# Patient Record
Sex: Female | Born: 1935 | Race: White | Hispanic: No | Marital: Married | Smoking: Never smoker
Health system: Southern US, Community
[De-identification: ages and names within clinical notes are randomized; demographics above are authoritative.]

## PROBLEM LIST (undated history)

## (undated) DIAGNOSIS — K5909 Other constipation: Secondary | ICD-10-CM

## (undated) DIAGNOSIS — N3289 Other specified disorders of bladder: Secondary | ICD-10-CM

## (undated) DIAGNOSIS — M5126 Other intervertebral disc displacement, lumbar region: Secondary | ICD-10-CM

## (undated) DIAGNOSIS — Z7901 Long term (current) use of anticoagulants: Secondary | ICD-10-CM

## (undated) DIAGNOSIS — K219 Gastro-esophageal reflux disease without esophagitis: Secondary | ICD-10-CM

## (undated) DIAGNOSIS — G909 Disorder of the autonomic nervous system, unspecified: Secondary | ICD-10-CM

## (undated) DIAGNOSIS — I4891 Unspecified atrial fibrillation: Secondary | ICD-10-CM

## (undated) DIAGNOSIS — E538 Deficiency of other specified B group vitamins: Secondary | ICD-10-CM

## (undated) DIAGNOSIS — E559 Vitamin D deficiency, unspecified: Secondary | ICD-10-CM

## (undated) DIAGNOSIS — K439 Ventral hernia without obstruction or gangrene: Secondary | ICD-10-CM

## (undated) DIAGNOSIS — I10 Essential (primary) hypertension: Secondary | ICD-10-CM

## (undated) DIAGNOSIS — L84 Corns and callosities: Secondary | ICD-10-CM

## (undated) DIAGNOSIS — N3281 Overactive bladder: Secondary | ICD-10-CM

## (undated) DIAGNOSIS — R609 Edema, unspecified: Secondary | ICD-10-CM

## (undated) DIAGNOSIS — J449 Chronic obstructive pulmonary disease, unspecified: Secondary | ICD-10-CM

## (undated) DIAGNOSIS — I872 Venous insufficiency (chronic) (peripheral): Secondary | ICD-10-CM

## (undated) DIAGNOSIS — R21 Rash and other nonspecific skin eruption: Secondary | ICD-10-CM

## (undated) DIAGNOSIS — I509 Heart failure, unspecified: Secondary | ICD-10-CM

## (undated) DIAGNOSIS — M109 Gout, unspecified: Secondary | ICD-10-CM

## (undated) DIAGNOSIS — J45902 Unspecified asthma with status asthmaticus: Secondary | ICD-10-CM

## (undated) DIAGNOSIS — G4700 Insomnia, unspecified: Secondary | ICD-10-CM

## (undated) HISTORY — PX: ABDOMINAL HYSTERECTOMY: SHX81

## (undated) HISTORY — DX: Long term (current) use of anticoagulants: Z79.01

## (undated) HISTORY — PX: REPLACEMENT TOTAL KNEE BILATERAL: SUR1225

## (undated) HISTORY — DX: Corns and callosities: L84

## (undated) HISTORY — DX: Other intervertebral disc displacement, lumbar region: M51.26

## (undated) HISTORY — DX: Disorder of the autonomic nervous system, unspecified: G90.9

## (undated) HISTORY — DX: Rash and other nonspecific skin eruption: R21

## (undated) HISTORY — DX: Unspecified atrial fibrillation: I48.91

## (undated) HISTORY — DX: Unspecified asthma with status asthmaticus: J45.902

## (undated) HISTORY — DX: Vitamin D deficiency, unspecified: E55.9

## (undated) HISTORY — PX: TONSILLECTOMY: SUR1361

## (undated) HISTORY — DX: Heart failure, unspecified: I50.9

## (undated) HISTORY — DX: Venous insufficiency (chronic) (peripheral): I87.2

## (undated) HISTORY — DX: Essential (primary) hypertension: I10

## (undated) HISTORY — DX: Deficiency of other specified B group vitamins: E53.8

## (undated) HISTORY — DX: Insomnia, unspecified: G47.00

## (undated) HISTORY — DX: Gastro-esophageal reflux disease without esophagitis: K21.9

## (undated) HISTORY — DX: Ventral hernia without obstruction or gangrene: K43.9

## (undated) HISTORY — DX: Other specified disorders of bladder: N32.89

## (undated) HISTORY — DX: Gout, unspecified: M10.9

## (undated) HISTORY — DX: Chronic obstructive pulmonary disease, unspecified: J44.9

## (undated) HISTORY — DX: Overactive bladder: N32.81

## (undated) HISTORY — PX: BLADDER SURGERY: SHX569

## (undated) HISTORY — DX: Other constipation: K59.09

## (undated) HISTORY — DX: Edema, unspecified: R60.9

## (undated) HISTORY — PX: CATARACT EXTRACTION: SUR2

---

## 2015-03-01 DIAGNOSIS — I10 Essential (primary) hypertension: Secondary | ICD-10-CM | POA: Insufficient documentation

## 2015-03-01 DIAGNOSIS — I4892 Unspecified atrial flutter: Secondary | ICD-10-CM

## 2015-03-01 DIAGNOSIS — Z86711 Personal history of pulmonary embolism: Secondary | ICD-10-CM

## 2015-03-01 HISTORY — DX: Personal history of pulmonary embolism: Z86.711

## 2015-03-01 HISTORY — DX: Unspecified atrial flutter: I48.92

## 2015-03-01 HISTORY — DX: Essential (primary) hypertension: I10

## 2017-06-08 ENCOUNTER — Telehealth: Payer: Self-pay | Admitting: Cardiology

## 2017-06-08 NOTE — Telephone Encounter (Signed)
°*  STAT* If patient is at the pharmacy, call can be transferred to refill team.   1. Which medications need to be refilled? (please list name of each medication and dose if known) Metoprolol Tartrate 25mg   2. Which pharmacy/location (including street and city if local pharmacy) is medication to be sent to?[facility]   3. Do they need a 30 day or 90 day supply? 90   *STAT* If patient is at the pharmacy, call can be transferred to refill team.   1. Which medications need to be refilled? (please list name of each medication and dose if known) Metorpolol 50mg   2. Which pharmacy/location (including street and city if local pharmacy) is medication to be sent to? [facility]   3. Do they need a 30 day or 90 day supply? 90

## 2017-06-08 NOTE — Telephone Encounter (Signed)
Rx refill

## 2017-06-08 NOTE — Telephone Encounter (Signed)
[facility] will contact pt to verify dosage

## 2017-06-08 NOTE — Telephone Encounter (Signed)
Lm For pt to call back to clarify dose before sending in rx.Marland KitchenMarland Kitchen

## 2017-06-09 ENCOUNTER — Other Ambulatory Visit: Payer: Self-pay

## 2017-06-09 MED ORDER — METOPROLOL TARTRATE 50 MG PO TABS: 50.0000 mg | ORAL_TABLET | Freq: Every day | ORAL | 3 refills | Status: DC

## 2017-06-09 MED ORDER — METOPROLOL TARTRATE 25 MG PO TABS: 25.0000 mg | ORAL_TABLET | Freq: Every day | ORAL | 3 refills | Status: DC

## 2017-06-09 NOTE — Telephone Encounter (Signed)
Spoke with pts husband to verify dose of metoprolol. Pt is currently taking metoprolol tart 50mg  in the am, and metorpolol tart 25mg  in the evening.    Pts husband request both mgs to be sent to pharmacy, as it is not easy to cut the pills in half.  Both sent to pharmacy.

## 2017-06-15 ENCOUNTER — Other Ambulatory Visit: Payer: Self-pay

## 2017-06-30 ENCOUNTER — Encounter: Payer: Self-pay | Admitting: Cardiology

## 2017-06-30 ENCOUNTER — Ambulatory Visit: Payer: Medicare Other | Admitting: Cardiology

## 2017-06-30 VITALS — BP 112/70 | HR 70 | Ht 61.0 in | Wt 195.1 lb

## 2017-06-30 DIAGNOSIS — I4892 Unspecified atrial flutter: Secondary | ICD-10-CM

## 2017-06-30 DIAGNOSIS — I10 Essential (primary) hypertension: Secondary | ICD-10-CM

## 2017-06-30 DIAGNOSIS — Z86711 Personal history of pulmonary embolism: Secondary | ICD-10-CM

## 2017-06-30 MED ORDER — RIVAROXABAN 20 MG PO TABS: 20.0000 mg | ORAL_TABLET | Freq: Every day | ORAL | 1 refills | Status: DC

## 2017-06-30 NOTE — Addendum Note (Signed)
Addended by: Craige Cotta on: 06/30/2017 02:43 PM   Modules accepted: Orders

## 2017-06-30 NOTE — Patient Instructions (Addendum)
Medication Instructions:  Your physician recommends that you continue on your current medications as directed. Please refer to the Current Medication list given to you today.  Xarelto samples given and refill sent Stop Metoprolol 25 mg  Labwork: .None  Testing/Procedures: None  Follow-Up: Your physician recommends that you schedule a follow-up appointment in: 6 months   Any Other Special Instructions Will Be Listed Below (If Applicable).     If you need a refill on your cardiac medications before your next appointment, please call your pharmacy.   [facility]  Garey Ham, RN, BSN

## 2017-06-30 NOTE — Progress Notes (Signed)
Cardiology Office Note:    Date:  06/30/2017   ID:  Kerri Rogers, DOB February 01, 1935, MRN [id number]  PCP:  Lucianne LeiUppin, Nina, MD  Cardiologist:  Garwin Brothersajan R Londa Mackowski, MD   Referring MD: No ref. provider found    ASSESSMENT:    1. Atrial flutter, paroxysmal (HCC)   2. Essential hypertension   3. History of pulmonary embolism    PLAN:    In order of problems listed above:  1. I discussed with the patient atrial fibrillation, disease process. Management and therapy including rate and rhythm control, anticoagulation benefits and potential risks were discussed extensively with the patient. Patient had multiple questions which were answered to patient's satisfaction. 2. Her blood pressure is stable. 3. Importance of regular exercise stressed.  Diet was discussed for obesity and risks of obesity explained. 4. Patient will be seen in follow-up appointment in 6 months or earlier if the patient has any concerns.   Medication Adjustments/Labs and Tests Ordered: Current medicines are reviewed at length with the patient today.  Concerns regarding medicines are outlined above.  No orders of the defined types were placed in this encounter.  No orders of the defined types were placed in this encounter.    History of Present Illness:    Kerri BalDoris Bridgeforth is a 82 y.o. female who is being seen today for the evaluation of atrial flutter and fibrillation.  Patient is a pleasant 82 year old female.  This patient has been under my care in my previous practice.  He is here now to transfer his care and be established with my current practice.  She has atrial flutter and fibrillation.  She denies any problems at this time and takes care of activities of daily living.  No chest pain orthopnea or PND.  At the time of my evaluation, the patient is alert awake oriented and in no distress.  She recently lost her son but has taken it well and moving forward in a strong way.  Past Medical History:  Diagnosis Date  . Asthma  with bronchitis and status asthmaticus   . Atrial fibrillation (HCC)   . B12 deficiency   . Bladder spasm   . CHF (congestive heart failure) (HCC)   . Chronic constipation   . COPD (chronic obstructive pulmonary disease) (HCC)   . Edema   . GERD (gastroesophageal reflux disease)   . Gout   . Herniated lumbar intervertebral disc   . Hypertension   . Insomnia   . Long term (current) use of anticoagulants   . Overactive bladder   . Peripheral autonomic neuropathy   . Pre-ulcerative calluses   . Skin rash   . Stasis dermatitis   . Ventral hernia   . Vitamin D deficiency     Past Surgical History:  Procedure Laterality Date  . ABDOMINAL HYSTERECTOMY    . BLADDER SURGERY    . CATARACT EXTRACTION    . REPLACEMENT TOTAL KNEE BILATERAL    . TONSILLECTOMY      Current Medications: Current Meds  Medication Sig  . B Complex Vitamins (VITAMIN B COMPLEX PO) Take 1 tablet daily by mouth.   . calcium carbonate (OS-CAL) 600 MG TABS tablet Take 1,200 mg daily by mouth.   . Cholecalciferol (VITAMIN D3) 1000 units CAPS Take by mouth.  . DOCOSAHEXAENOIC ACID PO Take 1,200 mg daily by mouth.   . ferrous sulfate 324 (65 Fe) MG TBEC Take 324 mg by mouth.  . furosemide (LASIX) 20 MG tablet Take 20 mg 2 (two)  times daily by mouth.   . gabapentin (NEURONTIN) 800 MG tablet Take 800 mg daily by mouth.   Marland Kitchen MAGNESIUM PO Take 1 tablet daily by mouth.   . metoprolol tartrate (LOPRESSOR) 50 MG tablet Take 1 tablet (50 mg total) by mouth daily.  . Potassium 99 MG TABS Take 99 mg daily by mouth.   . rivaroxaban (XARELTO) 20 MG TABS tablet Take 20 mg daily with supper by mouth.   . [DISCONTINUED] metoprolol tartrate (LOPRESSOR) 25 MG tablet Take 1 tablet (25 mg total) by mouth at bedtime.     Allergies:   Flagyl [metronidazole]; Penicillins; and Sulfamethoxazole-trimethoprim   Social History   Socioeconomic History  . Marital status: Married    Spouse name: None  . Number of children: None  .  Years of education: None  . Highest education level: None  Social Needs  . Financial resource strain: None  . Food insecurity - worry: None  . Food insecurity - inability: None  . Transportation needs - medical: None  . Transportation needs - non-medical: None  Occupational History  . None  Tobacco Use  . Smoking status: Never Smoker  . Smokeless tobacco: Never Used  Substance and Sexual Activity  . Alcohol use: No  . Drug use: No  . Sexual activity: None  Other Topics Concern  . None  Social History Narrative  . None     Family History: The patient's family history includes Anxiety disorder in her sister; Asthma in her sister; Atrial fibrillation in her sister; Diabetes in her mother; GER disease in her sister; Heart failure in her mother; Hypertension in her brother.  ROS:   Please see the history of present illness.    All other systems reviewed and are negative.  EKGs/Labs/Other Studies Reviewed:    The following studies were reviewed today: I reviewed previous records and discussed with the patient at length.   Recent Labs: No results found for requested labs within last 8760 hours.  Recent Lipid Panel No results found for: CHOL, TRIG, HDL, CHOLHDL, VLDL, LDLCALC, LDLDIRECT  Physical Exam:    VS:  BP 112/70   Pulse 70   Ht 5\' 1"  (1.549 m)   Wt 195 lb 1.3 oz (88.5 kg)   SpO2 94%   BMI 36.86 kg/m     Wt Readings from Last 3 Encounters:  06/30/17 195 lb 1.3 oz (88.5 kg)     GEN: Patient is in no acute distress HEENT: Normal NECK: No JVD; No carotid bruits LYMPHATICS: No lymphadenopathy CARDIAC: S1 S2 regular, 2/6 systolic murmur at the apex. RESPIRATORY:  Clear to auscultation without rales, wheezing or rhonchi  ABDOMEN: Soft, non-tender, non-distended MUSCULOSKELETAL:  No edema; No deformity  SKIN: Warm and dry NEUROLOGIC:  Alert and oriented x 3 PSYCHIATRIC:  Normal affect    Signed, Garwin Brothers, MD  06/30/2017 11:12 AM    [facility]

## 2017-06-30 NOTE — Addendum Note (Signed)
Addended by: Craige Cotta on: 06/30/2017 11:19 AM   Modules accepted: Orders

## 2017-09-24 ENCOUNTER — Ambulatory Visit: Payer: Medicare Other | Admitting: Endocrinology

## 2017-09-24 ENCOUNTER — Encounter: Payer: Self-pay | Admitting: Endocrinology

## 2017-09-24 LAB — TSH: TSH: 1.54 u[IU]/mL (ref 0.35–4.50)

## 2017-09-24 LAB — BASIC METABOLIC PANEL
BUN: 37 mg/dL — AB (ref 6–23)
CALCIUM: 9.9 mg/dL (ref 8.4–10.5)
CO2: 32 mEq/L (ref 19–32)
CREATININE: 1.39 mg/dL — AB (ref 0.40–1.20)
Chloride: 104 mEq/L (ref 96–112)
GFR: 38.7 mL/min — AB (ref >60.00)
Glucose, Bld: 97 mg/dL (ref 70–99)
Potassium: 4.3 mEq/L (ref 3.5–5.1)
Sodium: 140 mEq/L (ref 135–145)

## 2017-09-24 NOTE — Patient Instructions (Signed)
Stop Vitamin D

## 2017-09-24 NOTE — Progress Notes (Signed)
Patient ID: Kerri Rogers, female   DOB: April 02, 1935, 83 y.o.   MRN: [id number]          Chief complaint: High calcium  History of Present Illness:  Referring physician:   Review of records show that she has had a high calcium since at least 10/2016  No detailed records are available about her previous calcium from her PCP but she is being referred here since her recent calcium in November 2018 was 10.9, previously 10.3 PTH level is not available Patient was told to stop her calcium supplements in November which she was taking for bone health  She has been taking vitamin D, 1000 units daily separately for several years  No results found for: CALCIUM  The hypercalcemia is not associated with any fractures, renal insufficiency, history of kidney stones, sarcoidosis, known carcinoma, thyroid disease.   She probably has had a height loss of about 2 inches since youth She thinks she had a total hysterectomy at the age of 9 and took hormone replacement for about 4-5 years subsequently  Prior serologic and radiologic studies have included:  Bone density in 2015 was normal with T score 0.7, actual report not available    No results found for: PTH, CALCIUM, CAION, PHOS    25 (OH) Vitamin D level 81.3  Prior treatment has included  Allergies as of 09/24/2017      Reactions   Flagyl [metronidazole]    Penicillins    Sulfamethoxazole-trimethoprim       Medication List        Accurate as of 09/24/17  2:51 PM. Always use your most recent med list.          DOCOSAHEXAENOIC ACID PO Take 1,200 mg daily by mouth.   ferrous sulfate 324 (65 Fe) MG Tbec Take 324 mg by mouth.   furosemide 20 MG tablet Commonly known as:  LASIX Take 20 mg 2 (two) times daily by mouth.   gabapentin 800 MG tablet Commonly known as:  NEURONTIN Take 800 mg daily by mouth.   MAGNESIUM PO Take 1 tablet daily by mouth.   metoprolol tartrate 50 MG tablet Commonly known as:  LOPRESSOR Take 1 tablet (50  mg total) by mouth daily.   multivitamin tablet Take 1 tablet by mouth daily.   OXYBUTYNIN CHLORIDE PO Take 5 mg by mouth at bedtime.   Potassium 99 MG Tabs Take 99 mg daily by mouth.   rivaroxaban 20 MG Tabs tablet Commonly known as:  XARELTO Take 1 tablet (20 mg total) daily with supper by mouth.   traMADol 50 MG tablet Commonly known as:  ULTRAM Take 50 mg by mouth every 6 (six) hours as needed.   VITAMIN B COMPLEX PO Take 1 tablet daily by mouth.   Vitamin D3 1000 units Caps Take by mouth.       Allergies:  Allergies  Allergen Reactions  . Flagyl [Metronidazole]   . Penicillins   . Sulfamethoxazole-Trimethoprim     Past Medical History:  Diagnosis Date  . Asthma with bronchitis and status asthmaticus   . Atrial fibrillation (HCC)   . B12 deficiency   . Bladder spasm   . CHF (congestive heart failure) (HCC)   . Chronic constipation   . COPD (chronic obstructive pulmonary disease) (HCC)   . Edema   . GERD (gastroesophageal reflux disease)   . Gout   . Herniated lumbar intervertebral disc   . Hypertension   . Insomnia   . Long term (current) use  of anticoagulants   . Overactive bladder   . Peripheral autonomic neuropathy   . Pre-ulcerative calluses   . Skin rash   . Stasis dermatitis   . Ventral hernia   . Vitamin D deficiency     Past Surgical History:  Procedure Laterality Date  . ABDOMINAL HYSTERECTOMY    . BLADDER SURGERY    . CATARACT EXTRACTION    . REPLACEMENT TOTAL KNEE BILATERAL    . TONSILLECTOMY      Family History  Problem Relation Age of Onset  . Diabetes Mother   . Heart failure Mother   . GER disease Sister   . Anxiety disorder Sister   . Atrial fibrillation Sister   . Asthma Sister   . Hypertension Brother     Social History:  reports that  has never smoked. she has never used smokeless tobacco. She reports that she does not drink alcohol or use drugs.  Review of Systems  Constitutional: Negative for reduced  appetite.       She has lost a little weight recently from trying  HENT: Negative for trouble swallowing.   Respiratory: Negative for shortness of breath.   Cardiovascular: Positive for leg swelling.       No recent palpitations or symptoms of her cardiac arrhythmia which is being followed by cardiologist  Gastrointestinal: Negative for abdominal pain.  Endocrine: Negative for fatigue, general weakness and polydipsia.  Genitourinary: Negative for dysuria.  Musculoskeletal: Positive for joint pain.  Skin: Negative for rash.  Neurological: Positive for numbness and balance difficulty.     EXAM:  BP 113/73 (BP Location: Left Arm, Patient Position: Sitting, Cuff Size: Large)   Pulse 82   Ht 5\' 1"  (1.549 m)   Wt 193 lb 6.4 oz (87.7 kg)   SpO2 94%   BMI 36.54 kg/m   GENERAL: She has generalized obesity especially abdominal  No pallor, clubbing, lymphadenopathy in the neck    Skin:  no rash or pigmentation.  EYES:  Externally normal.  Fundii:  Not examined  ENT: Oral mucosa and tongue normal.  THYROID:  Not palpable.   HEART:  Normal  S1 and S2; no murmur or click.  CHEST:  Normal shape Lungs:   Vescicular breath sounds heard equally.  No crepitations/ wheeze.  ABDOMEN:  No distention.  Liver and spleen not palpable.  No other mass or tenderness.  NEUROLOGICAL: .Reflexes are normal bilaterally at biceps, absent at ankles.  SPINE AND JOINTS:  Normal.  Extremities: she has 2+ edema on the left and 1+ on the right ankle  Assessment/Plan:   HYPERCALCEMIA:  She has had mild hypercalcemia with the last level of 10.9, prior level 10.3 and no other records available for review  She appears to be asymptomatic although has not had a bone density since probably 2015 Most likely she has mild primary hyperparathyroidism, has not had any medications or other conditions to cause hypercalcemia However her vitamin D level is upper normal as of 11/18  Discussed that the main  implication of hyperparathyroidism would be bone loss which she has not had so far despite her having premature menopause  Discussed the nature of primary hyperparathyroidism as well as normal role of the parathyroid glands. Discussed potential  effects of hyperparathyroidism long-term on bone health, kidney stones and kidney function Explained to patient that surgery is indicated only there are symptoms of high calcium, calcium level over 1 point above the normal range or known osteoporosis.  Explained that if surgery  is indicated this would be done after doing a parathyroid scan and most likely if the patient has single adenoma will need minimally invasive surgery  Recommendations:  Repeat bone density unless it has been done within the last year or 2  Stop vitamin D  Check PTH and 1, 25 hydroxy vitamin D level  If her bone density is still normal and she has hyperparathyroidism and no further action needed  Reather Littler 09/24/2017, 2:51 PM

## 2017-09-28 LAB — PARATHYROID HORMONE, INTACT (NO CA): PTH: 37 pg/mL (ref 15–65)

## 2017-09-28 LAB — VITAMIN D 1,25 DIHYDROXY
VITAMIN D3 1, 25 (OH): 21 pg/mL
Vitamin D 1, 25 (OH)2 Total: 21 pg/mL
Vitamin D2 1, 25 (OH)2: <10 pg/mL

## 2017-10-05 ENCOUNTER — Telehealth: Payer: Self-pay

## 2017-10-05 NOTE — Telephone Encounter (Signed)
-----   Message from Reather Littler, MD sent at 10/01/2017  8:23 PM EST ----- Her blood tests show overactive parathyroid gland likely; vitamin D is relatively low and she can increase the dose to 2000 units daily She needs to have bone density report forwarded to [place] from [facility]

## 2017-10-05 NOTE — Telephone Encounter (Signed)
Pt is returning call about lab results.  Please advise

## 2017-10-05 NOTE — Telephone Encounter (Signed)
Spoke to patient. Gave lab results and instructions. Pt verbalized understanding.

## 2017-10-05 NOTE — Telephone Encounter (Signed)
Called pt. No answer. Left vmail to rtn call.

## 2017-10-06 ENCOUNTER — Telehealth: Payer: Self-pay

## 2017-10-06 NOTE — Telephone Encounter (Signed)
Received copy of bone density from [facility] and pt is aware that it is normal and to have repeat in 2 years

## 2018-01-05 ENCOUNTER — Ambulatory Visit: Payer: Medicare Other | Admitting: Cardiology

## 2018-01-05 ENCOUNTER — Encounter: Payer: Self-pay | Admitting: Cardiology

## 2018-01-05 VITALS — BP 130/68 | HR 102 | Ht 61.0 in | Wt 195.1 lb

## 2018-01-05 DIAGNOSIS — I10 Essential (primary) hypertension: Secondary | ICD-10-CM | POA: Diagnosis not present

## 2018-01-05 DIAGNOSIS — I4892 Unspecified atrial flutter: Secondary | ICD-10-CM

## 2018-01-05 NOTE — Progress Notes (Signed)
Cardiology Office Note:    Date:  01/05/2018   ID:  Kerri Rogers, DOB November 14, 1934, MRN [id number]  PCP:  Kerri Lei, MD  Cardiologist:  Kerri Brothers, MD   Referring MD: Kerri Lei, MD    ASSESSMENT:    1. Atrial flutter, paroxysmal (HCC)   2. Essential hypertension    PLAN:    In order of problems listed above:  1. Primary prevention stressed with the patient.  Importance of compliance with diet and medications stressed and she vocalized understanding.  Her blood pressure is stable. 2. I discussed with the patient atrial fibrillation, disease process. Management and therapy including rate and rhythm control, anticoagulation benefits and potential risks were discussed extensively with the patient. Patient had multiple questions which were answered to patient's satisfaction. 3. She had blood work by her primary care physician on a regular basis and plans to get more blood work when she sees her in a month or so.  Her heart rate is stable and she checks it on a regular basis.  She has no symptoms at this time and she will continue her medical management.Patient will be seen in follow-up appointment in 6 months or earlier if the patient has any concerns.    Medication Adjustments/Labs and Tests Ordered: Current medicines are reviewed at length with the patient today.  Concerns regarding medicines are outlined above.  No orders of the defined types were placed in this encounter.  No orders of the defined types were placed in this encounter.    Chief Complaint  Patient presents with  . Follow-up  . Atrial Fibrillation     History of Present Illness:    Kerri Rogers is a 83 y.o. female.  The patient has history of atrial flutter and fibrillation.  She is on anticoagulation for this.  She denies any problems at this time and takes care of activities of daily living.  She leads a fairly sedentary lifestyle because of orthopedic issues and ambulates with a cane.  Her gait is stable  and she has not had any falls.  At the time of my evaluation, the patient is alert awake oriented and in no distress.  Past Medical History:  Diagnosis Date  . Asthma with bronchitis and status asthmaticus   . Atrial fibrillation (HCC)   . B12 deficiency   . Bladder spasm   . CHF (congestive heart failure) (HCC)   . Chronic constipation   . COPD (chronic obstructive pulmonary disease) (HCC)   . Edema   . GERD (gastroesophageal reflux disease)   . Gout   . Herniated lumbar intervertebral disc   . Hypertension   . Insomnia   . Long term (current) use of anticoagulants   . Overactive bladder   . Peripheral autonomic neuropathy   . Pre-ulcerative calluses   . Skin rash   . Stasis dermatitis   . Ventral hernia   . Vitamin D deficiency     Past Surgical History:  Procedure Laterality Date  . ABDOMINAL HYSTERECTOMY    . BLADDER SURGERY    . CATARACT EXTRACTION    . REPLACEMENT TOTAL KNEE BILATERAL    . TONSILLECTOMY      Current Medications: Current Meds  Medication Sig  . B Complex Vitamins (VITAMIN B COMPLEX PO) Take 1 tablet daily by mouth.   . Cholecalciferol (VITAMIN D3) 1000 units CAPS Take 1 capsule by mouth daily.   . DOCOSAHEXAENOIC ACID PO Take 1,200 mg daily by mouth.   . ferrous  sulfate 324 (65 Fe) MG TBEC Take 324 mg by mouth daily.   . furosemide (LASIX) 20 MG tablet Take 20 mg 2 (two) times daily by mouth.   . gabapentin (NEURONTIN) 800 MG tablet Take 800 mg daily by mouth.   Marland Kitchen MAGNESIUM PO Take 1 tablet daily by mouth.   . metoprolol tartrate (LOPRESSOR) 50 MG tablet Take 1 tablet (50 mg total) by mouth daily.  . Multiple Vitamin (MULTIVITAMIN) tablet Take 1 tablet by mouth daily.  . OXYBUTYNIN CHLORIDE PO Take 5 mg by mouth at bedtime.  . Potassium 99 MG TABS Take 99 mg daily by mouth.   . rivaroxaban (XARELTO) 20 MG TABS tablet Take 1 tablet (20 mg total) daily with supper by mouth.  . traMADol (ULTRAM) 50 MG tablet Take 50 mg by mouth every 6 (six) hours  as needed.     Allergies:   Flagyl [metronidazole]; Penicillins; and Sulfamethoxazole-trimethoprim   Social History   Socioeconomic History  . Marital status: Married    Spouse name: Not on file  . Number of children: Not on file  . Years of education: Not on file  . Highest education level: Not on file  Occupational History  . Not on file  Social Needs  . Financial resource strain: Not on file  . Food insecurity:    Worry: Not on file    Inability: Not on file  . Transportation needs:    Medical: Not on file    Non-medical: Not on file  Tobacco Use  . Smoking status: Never Smoker  . Smokeless tobacco: Never Used  Substance and Sexual Activity  . Alcohol use: No  . Drug use: No  . Sexual activity: Not on file  Lifestyle  . Physical activity:    Days per week: Not on file    Minutes per session: Not on file  . Stress: Not on file  Relationships  . Social connections:    Talks on phone: Not on file    Gets together: Not on file    Attends religious service: Not on file    Active member of club or organization: Not on file    Attends meetings of clubs or organizations: Not on file    Relationship status: Not on file  Other Topics Concern  . Not on file  Social History Narrative  . Not on file     Family History: The patient's family history includes Anxiety disorder in her sister; Asthma in her sister; Atrial fibrillation in her sister; Diabetes in her mother; GER disease in her sister; Heart failure in her mother; Hypertension in her brother. There is no history of Thyroid disease.  ROS:   Please see the history of present illness.    All other systems reviewed and are negative.  EKGs/Labs/Other Studies Reviewed:    The following studies were reviewed today: I discussed my findings with the patient at length.   Recent Labs: 09/24/2017: BUN 37; Creatinine, Ser 1.39; Potassium 4.3; Sodium 140; TSH 1.54  Recent Lipid Panel No results found for: CHOL, TRIG,  HDL, CHOLHDL, VLDL, LDLCALC, LDLDIRECT  Physical Exam:    VS:  BP 130/68 (BP Location: Left Arm, Patient Position: Sitting, Cuff Size: Normal)   Pulse (!) 102   Ht 5\' 1"  (1.549 m)   Wt 195 lb 1.9 oz (88.5 kg)   SpO2 95%   BMI 36.87 kg/m     Wt Readings from Last 3 Encounters:  01/05/18 195 lb 1.9 oz (88.5  kg)  09/24/17 193 lb 6.4 oz (87.7 kg)  06/30/17 195 lb 1.3 oz (88.5 kg)     GEN: Patient is in no acute distress HEENT: Normal NECK: No JVD; No carotid bruits LYMPHATICS: No lymphadenopathy CARDIAC: Hear sounds regular, 2/6 systolic murmur at the apex. RESPIRATORY:  Clear to auscultation without rales, wheezing or rhonchi  ABDOMEN: Soft, non-tender, non-distended MUSCULOSKELETAL:  No edema; No deformity  SKIN: Warm and dry NEUROLOGIC:  Alert and oriented x 3 PSYCHIATRIC:  Normal affect   Signed, Kerri Brothers, MD  01/05/2018 3:17 PM    [facility]

## 2018-01-05 NOTE — Patient Instructions (Signed)
Medication Instructions:  Your physician recommends that you continue on your current medications as directed. Please refer to the Current Medication list given to you today.  Labwork: None  Testing/Procedures: None  Follow-Up: Your physician recommends that you schedule a follow-up appointment in: 6 months  Any Other Special Instructions Will Be Listed Below (If Applicable).     If you need a refill on your cardiac medications before your next appointment, please call your pharmacy.   [facility]  Vallen Calabrese A, RN, BSN  

## 2018-01-12 ENCOUNTER — Other Ambulatory Visit: Payer: Self-pay

## 2018-01-12 ENCOUNTER — Telehealth: Payer: Self-pay | Admitting: Cardiology

## 2018-01-12 MED ORDER — RIVAROXABAN 20 MG PO TABS: 20.0000 mg | ORAL_TABLET | Freq: Every day | ORAL | 2 refills | Status: DC

## 2018-01-12 NOTE — Telephone Encounter (Signed)
Med refill has been sent. 

## 2018-01-12 NOTE — Telephone Encounter (Signed)
°*  STAT* If patient is at the pharmacy, call can be transferred to refill team.   1. Which medications need to be refilled? (please list name of each medication and dose if known) Xarelto 20mg  takes 1 daily  2. Which pharmacy/location (including street and city if local pharmacy) is medication to be sent to? [facility]  3. Do they need a 30 day or 90 day supply? 90 with refills

## 2018-03-01 ENCOUNTER — Telehealth: Payer: Self-pay | Admitting: Cardiology

## 2018-03-01 ENCOUNTER — Other Ambulatory Visit: Payer: Self-pay

## 2018-03-01 MED ORDER — RIVAROXABAN 20 MG PO TABS: 20.0000 mg | ORAL_TABLET | Freq: Every day | ORAL | 2 refills | Status: DC

## 2018-03-01 NOTE — Telephone Encounter (Signed)
Refill has been sent.  °

## 2018-03-01 NOTE — Telephone Encounter (Signed)
Wants a written 90 day prescription of Xarelto

## 2018-03-03 ENCOUNTER — Other Ambulatory Visit: Payer: Self-pay

## 2018-03-03 MED ORDER — RIVAROXABAN 20 MG PO TABS: 20.0000 mg | ORAL_TABLET | Freq: Every day | ORAL | 2 refills | Status: DC

## 2018-05-25 ENCOUNTER — Encounter: Payer: Self-pay | Admitting: Cardiology

## 2018-05-25 ENCOUNTER — Ambulatory Visit (INDEPENDENT_AMBULATORY_CARE_PROVIDER_SITE_OTHER): Payer: Medicare Other | Admitting: Cardiology

## 2018-05-25 ENCOUNTER — Telehealth: Payer: Self-pay | Admitting: *Deleted

## 2018-05-25 VITALS — BP 98/66 | HR 100 | Ht 61.0 in | Wt 192.0 lb

## 2018-05-25 DIAGNOSIS — I10 Essential (primary) hypertension: Secondary | ICD-10-CM | POA: Diagnosis not present

## 2018-05-25 DIAGNOSIS — I4892 Unspecified atrial flutter: Secondary | ICD-10-CM | POA: Diagnosis not present

## 2018-05-25 MED ORDER — DIGOXIN 250 MCG PO TABS: 0.1250 mg | ORAL_TABLET | Freq: Every day | ORAL | 1 refills | Status: DC

## 2018-05-25 MED ORDER — METOPROLOL TARTRATE 25 MG PO TABS: 12.5000 mg | ORAL_TABLET | Freq: Two times a day (BID) | ORAL | 2 refills | Status: DC

## 2018-05-25 NOTE — Telephone Encounter (Signed)
Pt phoned to let Dr. Tomie China know that she is taking Myrbetriq ER 50 mg once daily. She was in to see him earlier and had forgotten to bring that medication with her.

## 2018-05-25 NOTE — Progress Notes (Signed)
Cardiology Office Note:    Date:  05/25/2018   ID:  Kerri Rogers, DOB 1935-06-02, MRN [id number]  PCP:  Lucianne Lei, MD  Cardiologist:  Garwin Brothers, MD   Referring MD: Lucianne Lei, MD    ASSESSMENT:    1. Atrial flutter, paroxysmal (HCC)   2. Essential hypertension    PLAN:    In order of problems listed above:  1. Primary prevention stressed with the patient.  Importance of compliance with diet and medication stressed and she vocalized understanding. 2. Her atrial fibrillation has an elevated ventricular rate.  She also has borderline blood pressure.  In view of the above I made the following changes. Patient will reduce furosemide dose to half dose which is half tablet daily that is 20 mg. 3. I will stop metoprolol and reduce the dose to 12.5 mg twice daily. 4. We will check a kidney function, Chem-7 and a TSH today.  I will start her on digoxin 0.25 mg daily for 3 days and cut down the dose 0.125 mg daily. 5. She will be seen in follow-up appointment in a month and will have pulse blood pressure check and EKG in 1 week.   Medication Adjustments/Labs and Tests Ordered: Current medicines are reviewed at length with the patient today.  Concerns regarding medicines are outlined above.  No orders of the defined types were placed in this encounter.  No orders of the defined types were placed in this encounter.    No chief complaint on file.    History of Present Illness:    Kerri Rogers is a 83 y.o. female.  Patient has past medical history of atrial fibrillation.  She is on anticoagulation.  She is referred here because of not feeling well and some lightheadedness at times.  No chest pain orthopnea or PND she has had one fall.  Her nurse mentions to her that her blood pressure was borderline and therefore she sent here for an evaluation.  At the time of my evaluation, the patient is alert awake oriented and in no distress.  Past Medical History:  Diagnosis Date  . Asthma  with bronchitis and status asthmaticus   . Atrial fibrillation (HCC)   . B12 deficiency   . Bladder spasm   . CHF (congestive heart failure) (HCC)   . Chronic constipation   . COPD (chronic obstructive pulmonary disease) (HCC)   . Edema   . GERD (gastroesophageal reflux disease)   . Gout   . Herniated lumbar intervertebral disc   . Hypertension   . Insomnia   . Long term (current) use of anticoagulants   . Overactive bladder   . Peripheral autonomic neuropathy   . Pre-ulcerative calluses   . Skin rash   . Stasis dermatitis   . Ventral hernia   . Vitamin D deficiency     Past Surgical History:  Procedure Laterality Date  . ABDOMINAL HYSTERECTOMY    . BLADDER SURGERY    . CATARACT EXTRACTION    . REPLACEMENT TOTAL KNEE BILATERAL    . TONSILLECTOMY      Current Medications: Current Meds  Medication Sig  . B Complex Vitamins (VITAMIN B COMPLEX PO) Take 1 tablet daily by mouth.   . DOCOSAHEXAENOIC ACID PO Take 1,200 mg daily by mouth.   . ferrous sulfate 324 (65 Fe) MG TBEC Take 324 mg by mouth daily.   . furosemide (LASIX) 20 MG tablet Take 20 mg 2 (two) times daily by mouth.   Marland Kitchen  gabapentin (NEURONTIN) 800 MG tablet Take 800 mg daily by mouth.   Marland Kitchen MAGNESIUM PO Take 1 tablet daily by mouth.   . Multiple Vitamin (MULTIVITAMIN) tablet Take 1 tablet by mouth daily.  . OXYBUTYNIN CHLORIDE PO Take 5 mg by mouth at bedtime.  . Potassium 99 MG TABS Take 99 mg daily by mouth.   . rivaroxaban (XARELTO) 20 MG TABS tablet Take 1 tablet (20 mg total) by mouth daily with supper.  . traMADol (ULTRAM) 50 MG tablet Take 50 mg by mouth every 6 (six) hours as needed.  . [DISCONTINUED] metoprolol tartrate (LOPRESSOR) 50 MG tablet Take 1 tablet (50 mg total) by mouth daily.     Allergies:   Flagyl [metronidazole]; Penicillins; and Sulfamethoxazole-trimethoprim   Social History   Socioeconomic History  . Marital status: Married    Spouse name: Not on file  . Number of children: Not on  file  . Years of education: Not on file  . Highest education level: Not on file  Occupational History  . Not on file  Social Needs  . Financial resource strain: Not on file  . Food insecurity:    Worry: Not on file    Inability: Not on file  . Transportation needs:    Medical: Not on file    Non-medical: Not on file  Tobacco Use  . Smoking status: Never Smoker  . Smokeless tobacco: Never Used  Substance and Sexual Activity  . Alcohol use: No  . Drug use: No  . Sexual activity: Not on file  Lifestyle  . Physical activity:    Days per week: Not on file    Minutes per session: Not on file  . Stress: Not on file  Relationships  . Social connections:    Talks on phone: Not on file    Gets together: Not on file    Attends religious service: Not on file    Active member of club or organization: Not on file    Attends meetings of clubs or organizations: Not on file    Relationship status: Not on file  Other Topics Concern  . Not on file  Social History Narrative  . Not on file     Family History: The patient's family history includes Anxiety disorder in her sister; Asthma in her sister; Atrial fibrillation in her sister; Diabetes in her mother; GER disease in her sister; Heart failure in her mother; Hypertension in her brother. There is no history of Thyroid disease.  ROS:   Please see the history of present illness.    All other systems reviewed and are negative.  EKGs/Labs/Other Studies Reviewed:    The following studies were reviewed today: I discussed my findings with the patient at extensive length.  EKG revealed atrial fibrillation with rapid ventricular rate.   Recent Labs: 09/24/2017: BUN 37; Creatinine, Ser 1.39; Potassium 4.3; Sodium 140; TSH 1.54  Recent Lipid Panel No results found for: CHOL, TRIG, HDL, CHOLHDL, VLDL, LDLCALC, LDLDIRECT  Physical Exam:    VS:  BP 98/66 (BP Location: Right Arm, Patient Position: Sitting, Cuff Size: Normal)   Pulse 100    Ht 5\' 1"  (1.549 m)   Wt 192 lb (87.1 kg)   SpO2 93%   BMI 36.28 kg/m     Wt Readings from Last 3 Encounters:  05/25/18 192 lb (87.1 kg)  01/05/18 195 lb 1.9 oz (88.5 kg)  09/24/17 193 lb 6.4 oz (87.7 kg)     GEN: Patient is in no  acute distress HEENT: Normal NECK: No JVD; No carotid bruits LYMPHATICS: No lymphadenopathy CARDIAC: Hear sounds regular, 2/6 systolic murmur at the apex. RESPIRATORY:  Clear to auscultation without rales, wheezing or rhonchi  ABDOMEN: Soft, non-tender, non-distended MUSCULOSKELETAL:  No edema; No deformity  SKIN: Warm and dry NEUROLOGIC:  Alert and oriented x 3 PSYCHIATRIC:  Normal affect   Signed, Garwin Brothers, MD  05/25/2018 2:43 PM    [facility]

## 2018-05-25 NOTE — Patient Instructions (Signed)
Medication Instructions:  Your physician has recommended you make the following change in your medication:   DECREASE furosemide to 20 mg once daily DECREASE metoprolol to 12.5 mg twice daily START digoxin 0.25 mg daily for 3 days then decrease to 0.125 mg daily  If you need a refill on your cardiac medications before your next appointment, please call your pharmacy.   Lab work: Your physician recommends that you have the following labs drawn: BMP and TSH today  If you have labs (blood work) drawn today and your tests are completely normal, you will receive your results only by: Marland Kitchen MyChart Message (if you have MyChart) OR . A paper copy in the mail If you have any lab test that is abnormal or we need to change your treatment, we will call you to review the results.  Testing/Procedures: Your physician has requested that you have an echocardiogram. Echocardiography is a painless test that uses sound waves to create images of your heart. It provides your doctor with information about the size and shape of your heart and how well your heart's chambers and valves are working. This procedure takes approximately one hour. There are no restrictions for this procedure.  Follow-Up: At [facility], you and your health needs are our priority.  As part of our continuing mission to provide you with exceptional heart care, we have created designated Provider Care Teams.  These Care Teams include your primary Cardiologist (physician) and Advanced Practice Providers (APPs -  Physician Assistants and Nurse Practitioners) who all work together to provide you with the care you need, when you need it.  You will need a follow up appointment in 1 months.  Please call our office 2 months in advance to schedule this appointment.  You may see another member of our [employer] Provider Team in [place]: Gypsy Balsam, MD . Norman Herrlich, MD  Any Other Special Instructions Will Be Listed Below (If  Applicable).  Please return to the office in 1 week for pulse, blood pressure and BMP.

## 2018-05-26 ENCOUNTER — Other Ambulatory Visit: Payer: Self-pay

## 2018-05-26 LAB — TSH: TSH: 0.997 u[IU]/mL (ref 0.450–4.500)

## 2018-05-26 LAB — BASIC METABOLIC PANEL
BUN/Creatinine Ratio: 22 (ref 12–28)
BUN: 26 mg/dL (ref 8–27)
CALCIUM: 10.5 mg/dL — AB (ref 8.7–10.3)
CHLORIDE: 103 mmol/L (ref 96–106)
CO2: 25 mmol/L (ref 20–29)
Creatinine, Ser: 1.17 mg/dL — ABNORMAL HIGH (ref 0.57–1.00)
GFR calc Af Amer: 50 mL/min/{1.73_m2} — ABNORMAL LOW (ref >59)
GFR calc non Af Amer: 44 mL/min/{1.73_m2} — ABNORMAL LOW (ref >59)
GLUCOSE: 184 mg/dL — AB (ref 65–99)
POTASSIUM: 4.1 mmol/L (ref 3.5–5.2)
SODIUM: 144 mmol/L (ref 134–144)

## 2018-05-26 NOTE — Telephone Encounter (Signed)
This medication has been added.

## 2018-06-01 ENCOUNTER — Ambulatory Visit (INDEPENDENT_AMBULATORY_CARE_PROVIDER_SITE_OTHER): Payer: Medicare Other | Admitting: Cardiology

## 2018-06-01 VITALS — BP 130/84 | HR 67 | Resp 16 | Ht 61.0 in | Wt 191.2 lb

## 2018-06-01 DIAGNOSIS — I10 Essential (primary) hypertension: Secondary | ICD-10-CM

## 2018-06-01 DIAGNOSIS — Z5181 Encounter for therapeutic drug level monitoring: Secondary | ICD-10-CM

## 2018-06-01 DIAGNOSIS — I4892 Unspecified atrial flutter: Secondary | ICD-10-CM

## 2018-06-01 DIAGNOSIS — Z79899 Other long term (current) drug therapy: Secondary | ICD-10-CM | POA: Diagnosis not present

## 2018-06-01 NOTE — Patient Instructions (Signed)
Medication Instructions:  Your physician recommends that you continue on your current medications as directed. Please refer to the Current Medication list given to you today.  If you need a refill on your cardiac medications before your next appointment, please call your pharmacy.   Lab work: Your physician recommends that you return for lab work today: BMP and Digoxin level  If you have labs (blood work) drawn today and your tests are completely normal, you will receive your results only by: Marland Kitchen MyChart Message (if you have MyChart) OR . A paper copy in the mail If you have any lab test that is abnormal or we need to change your treatment, we will call you to review the results.  Testing/Procedures: None.   Follow-Up: . Follow up as previously advised.   Any Other Special Instructions Will Be Listed Below (If Applicable).

## 2018-06-01 NOTE — Progress Notes (Signed)
Patient here for labs, blood pressure check and ekg after decreasing furosemide, metoprolol, and starting digoxin. Dr. Bing Matter reviewed this patient's ekg and vital signs and advised she continue her current management.He additionally will check a digoxin level with bmp. Patient scheduled for follow up appointment per Dr. Kem Parkinson last note. Patient verbally understands the plan.

## 2018-06-02 LAB — BASIC METABOLIC PANEL
BUN/Creatinine Ratio: 17 (ref 12–28)
BUN: 20 mg/dL (ref 8–27)
CALCIUM: 9.9 mg/dL (ref 8.7–10.3)
CHLORIDE: 100 mmol/L (ref 96–106)
CO2: 25 mmol/L (ref 20–29)
Creatinine, Ser: 1.17 mg/dL — ABNORMAL HIGH (ref 0.57–1.00)
GFR calc Af Amer: 50 mL/min/{1.73_m2} — ABNORMAL LOW (ref >59)
GFR calc non Af Amer: 44 mL/min/{1.73_m2} — ABNORMAL LOW (ref >59)
Glucose: 84 mg/dL (ref 65–99)
POTASSIUM: 4 mmol/L (ref 3.5–5.2)
SODIUM: 141 mmol/L (ref 134–144)

## 2018-06-02 LAB — DIGOXIN LEVEL: Digoxin, Serum: 0.9 ng/mL (ref 0.5–0.9)

## 2018-07-05 ENCOUNTER — Ambulatory Visit (INDEPENDENT_AMBULATORY_CARE_PROVIDER_SITE_OTHER): Payer: Medicare Other | Admitting: Cardiology

## 2018-07-05 ENCOUNTER — Encounter: Payer: Self-pay | Admitting: Cardiology

## 2018-07-05 VITALS — BP 112/68 | HR 61 | Ht 61.0 in | Wt 189.0 lb

## 2018-07-05 DIAGNOSIS — I10 Essential (primary) hypertension: Secondary | ICD-10-CM

## 2018-07-05 DIAGNOSIS — I4892 Unspecified atrial flutter: Secondary | ICD-10-CM

## 2018-07-05 DIAGNOSIS — Z86711 Personal history of pulmonary embolism: Secondary | ICD-10-CM

## 2018-07-05 NOTE — Progress Notes (Signed)
Cardiology Office Note:    Date:  07/05/2018   ID:  Kerri Rogers, DOB Jan 09, 1935, MRN [id number]  PCP:  Lucianne Lei, MD  Cardiologist:  Garwin Brothers, MD   Referring MD: Lucianne Lei, MD    ASSESSMENT:    1. Essential hypertension   2. Atrial flutter, paroxysmal (HCC)   3. History of pulmonary embolism    PLAN:    In order of problems listed above:  1. Primary prevention stressed to the patient.  Importance of compliance with diet and medication stressed and she vocalized understanding. 2. Patient is stable.  Diet was discussed and weight loss was stressed and she understood 3. I reviewed blood work with her at extensive length.  She is going to go home and call us about her digoxin dose and we will make sure she is taking the right dose.  I want her to take digoxin 0.125 mg daily. 4. Patient will be seen in follow-up appointment in 6 months or earlier if the patient has any concerns    Medication Adjustments/Labs and Tests Ordered: Current medicines are reviewed at length with the patient today.  Concerns regarding medicines are outlined above.  No orders of the defined types were placed in this encounter.  No orders of the defined types were placed in this encounter.    No chief complaint on file.    History of Present Illness:    Kerri Rogers is a 83 y.o. female.  Patient has history of essential hypertension, and congestive heart failure and atrial fibrillation.  She denies any problems at this time and takes care of activities of daily living.  No chest pain orthopnea or PND.  Her blood pressure is stable and she is feeling much better in the past several weeks and is happy with that.  She is not clear about her dose of digoxin.  Past Medical History:  Diagnosis Date  . Asthma with bronchitis and status asthmaticus   . Atrial fibrillation (HCC)   . B12 deficiency   . Bladder spasm   . CHF (congestive heart failure) (HCC)   . Chronic constipation   . COPD  (chronic obstructive pulmonary disease) (HCC)   . Edema   . GERD (gastroesophageal reflux disease)   . Gout   . Herniated lumbar intervertebral disc   . Hypertension   . Insomnia   . Long term (current) use of anticoagulants   . Overactive bladder   . Peripheral autonomic neuropathy   . Pre-ulcerative calluses   . Skin rash   . Stasis dermatitis   . Ventral hernia   . Vitamin D deficiency     Past Surgical History:  Procedure Laterality Date  . ABDOMINAL HYSTERECTOMY    . BLADDER SURGERY    . CATARACT EXTRACTION    . REPLACEMENT TOTAL KNEE BILATERAL    . TONSILLECTOMY      Current Medications: Current Meds  Medication Sig  . B Complex Vitamins (VITAMIN B COMPLEX PO) Take 1 tablet by mouth 3 (three) times a week.   . digoxin (LANOXIN) 0.25 MG tablet Take 0.5 tablets (0.125 mg total) by mouth daily. Take 1 tablet for 3 days then decrease to 0.5 tablet daily  . DOCOSAHEXAENOIC ACID PO Take 1,200 mg daily by mouth.   . ferrous sulfate 324 (65 Fe) MG TBEC Take 324 mg by mouth daily.   . furosemide (LASIX) 20 MG tablet Take 20 mg by mouth daily.  Marland Kitchen gabapentin (NEURONTIN) 800 MG tablet Take 800  mg daily by mouth.   Marland Kitchen MAGNESIUM PO Take 250 mg by mouth daily.   . metoprolol tartrate (LOPRESSOR) 25 MG tablet Take 0.5 tablets (12.5 mg total) by mouth 2 (two) times daily.  . mirabegron ER (MYRBETRIQ) 50 MG TB24 tablet Take 50 mg by mouth daily.  . Multiple Vitamin (MULTIVITAMIN) tablet Take 1 tablet by mouth daily.  . Potassium 99 MG TABS Take 99 mg daily by mouth.   . rivaroxaban (XARELTO) 20 MG TABS tablet Take 1 tablet (20 mg total) by mouth daily with supper.  . traMADol (ULTRAM) 50 MG tablet Take 50 mg by mouth every 6 (six) hours as needed.     Allergies:   Flagyl [metronidazole]; Penicillins; and Sulfamethoxazole-trimethoprim   Social History   Socioeconomic History  . Marital status: Married    Spouse name: Not on file  . Number of children: Not on file  . Years of  education: Not on file  . Highest education level: Not on file  Occupational History  . Not on file  Social Needs  . Financial resource strain: Not on file  . Food insecurity:    Worry: Not on file    Inability: Not on file  . Transportation needs:    Medical: Not on file    Non-medical: Not on file  Tobacco Use  . Smoking status: Never Smoker  . Smokeless tobacco: Never Used  Substance and Sexual Activity  . Alcohol use: No  . Drug use: No  . Sexual activity: Not on file  Lifestyle  . Physical activity:    Days per week: Not on file    Minutes per session: Not on file  . Stress: Not on file  Relationships  . Social connections:    Talks on phone: Not on file    Gets together: Not on file    Attends religious service: Not on file    Active member of club or organization: Not on file    Attends meetings of clubs or organizations: Not on file    Relationship status: Not on file  Other Topics Concern  . Not on file  Social History Narrative  . Not on file     Family History: The patient's family history includes Anxiety disorder in her sister; Asthma in her sister; Atrial fibrillation in her sister; Diabetes in her mother; GER disease in her sister; Heart failure in her mother; Hypertension in her brother. There is no history of Thyroid disease.  ROS:   Please see the history of present illness.    All other systems reviewed and are negative.  EKGs/Labs/Other Studies Reviewed:    The following studies were reviewed today: I discussed my findings with the patient at extensive length   Recent Labs: 05/25/2018: TSH 0.997 06/01/2018: BUN 20; Creatinine, Ser 1.17; Potassium 4.0; Sodium 141  Recent Lipid Panel No results found for: CHOL, TRIG, HDL, CHOLHDL, VLDL, LDLCALC, LDLDIRECT  Physical Exam:    VS:  BP 112/68 (BP Location: Right Arm, Patient Position: Sitting, Cuff Size: Normal)   Pulse 61   Ht 5\' 1"  (1.549 m)   Wt 189 lb (85.7 kg)   SpO2 95%   BMI 35.71  kg/m     Wt Readings from Last 3 Encounters:  07/05/18 189 lb (85.7 kg)  06/01/18 191 lb 3.2 oz (86.7 kg)  05/25/18 192 lb (87.1 kg)     GEN: Patient is in no acute distress HEENT: Normal NECK: No JVD; No carotid bruits LYMPHATICS: No  lymphadenopathy CARDIAC: Hear sounds regular, 2/6 systolic murmur at the apex. RESPIRATORY:  Clear to auscultation without rales, wheezing or rhonchi  ABDOMEN: Soft, non-tender, non-distended MUSCULOSKELETAL:  No edema; No deformity  SKIN: Warm and dry NEUROLOGIC:  Alert and oriented x 3 PSYCHIATRIC:  Normal affect   Signed, Garwin Brothers, MD  07/05/2018 3:13 PM    [facility]

## 2018-07-05 NOTE — Patient Instructions (Signed)
Medication Instructions:  Your physician recommends that you continue on your current medications as directed. Please refer to the Current Medication list given to you today.  If you need a refill on your cardiac medications before your next appointment, please call your pharmacy.   Lab work: None If you have labs (blood work) drawn today and your tests are completely normal, you will receive your results only by: . MyChart Message (if you have MyChart) OR . A paper copy in the mail If you have any lab test that is abnormal or we need to change your treatment, we will call you to review the results.  Testing/Procedures: None  Follow-Up: At [facility], you and your health needs are our priority.  As part of our continuing mission to provide you with exceptional heart care, we have created designated Provider Care Teams.  These Care Teams include your primary Cardiologist (physician) and Advanced Practice Providers (APPs -  Physician Assistants and Nurse Practitioners) who all work together to provide you with the care you need, when you need it. You will need a follow up appointment in 6 months.  Please call our office 2 months in advance to schedule this appointment.  You may see No primary care provider on file. or another member of our [facility] Provider Team in [place]: Robert Krasowski, MD . Brian Munley, MD  Any Other Special Instructions Will Be Listed Below (If Applicable).    

## 2018-07-06 ENCOUNTER — Telehealth: Payer: Self-pay | Admitting: Cardiology

## 2018-07-06 NOTE — Telephone Encounter (Signed)
error 

## 2018-07-09 ENCOUNTER — Other Ambulatory Visit: Payer: Medicare Other

## 2018-07-21 ENCOUNTER — Other Ambulatory Visit: Payer: Self-pay

## 2018-07-21 ENCOUNTER — Ambulatory Visit (INDEPENDENT_AMBULATORY_CARE_PROVIDER_SITE_OTHER): Payer: Medicare Other

## 2018-07-21 DIAGNOSIS — I4892 Unspecified atrial flutter: Secondary | ICD-10-CM

## 2018-07-21 MED ORDER — METOPROLOL TARTRATE 25 MG PO TABS: 12.5000 mg | ORAL_TABLET | Freq: Two times a day (BID) | ORAL | 1 refills | Status: DC

## 2018-07-21 NOTE — Progress Notes (Signed)
Complete echocardiogram has been performed.  Jimmy Vana Arif RDCS, RVT 

## 2018-09-16 ENCOUNTER — Other Ambulatory Visit: Payer: Self-pay | Admitting: Emergency Medicine

## 2018-09-16 MED ORDER — RIVAROXABAN 20 MG PO TABS: 20.0000 mg | ORAL_TABLET | Freq: Every day | ORAL | 1 refills | Status: DC

## 2018-09-16 NOTE — Addendum Note (Signed)
Addended by: Vanessa [place] R on: 09/16/2018 11:00 AM   Modules accepted: Orders

## 2018-10-13 ENCOUNTER — Telehealth: Payer: Self-pay | Admitting: *Deleted

## 2018-10-13 MED ORDER — METOPROLOL TARTRATE 25 MG PO TABS: 12.5000 mg | ORAL_TABLET | Freq: Two times a day (BID) | ORAL | 1 refills | Status: DC

## 2018-10-13 NOTE — Telephone Encounter (Signed)
*  STAT* If patient is at the pharmacy, call can be transferred to refill team.   1. Which medications need to be refilled? (please list name of each medication and dose if known) Metoprolol 25 mg, 0.5 tab bid   2. Which pharmacy/location (including street and city if local pharmacy) is medication to be sent to?[facility]  3. Do they need a 30 day or 90 day supply?

## 2018-10-25 ENCOUNTER — Other Ambulatory Visit: Payer: Self-pay | Admitting: Cardiology

## 2019-01-03 ENCOUNTER — Other Ambulatory Visit: Payer: Self-pay | Admitting: Cardiology

## 2019-01-03 NOTE — Telephone Encounter (Signed)
°*  STAT* If patient is at the pharmacy, call can be transferred to refill team.   1. Which medications need to be refilled? (please list name of each medication and dose if known) metoprolol tartrate (LOPRESSOR) 25 MG tablet   2. Which pharmacy/location (including street and city if local pharmacy) is medication to be sent to?  [facility] [facility] - [place], [place] - [address] [phone] (Phone) [phone] (Fax)    3. Do they need a 30 day or 90 day supply?90 day

## 2019-01-04 NOTE — Addendum Note (Signed)
Addended by: Jacques Earthly A on: 01/04/2019 09:17 AM   Modules accepted: Orders

## 2019-01-04 NOTE — Telephone Encounter (Signed)
Metoprolol refill sent to [facility] in [place] per pt request

## 2019-01-07 MED ORDER — METOPROLOL TARTRATE 25 MG PO TABS: 12.5000 mg | ORAL_TABLET | Freq: Two times a day (BID) | ORAL | 1 refills | Status: DC

## 2019-01-07 NOTE — Addendum Note (Signed)
Addended by: Jacques Earthly A on: 01/07/2019 09:23 AM   Modules accepted: Orders

## 2019-01-11 ENCOUNTER — Ambulatory Visit: Payer: Medicare Other | Admitting: Cardiology

## 2019-01-25 ENCOUNTER — Ambulatory Visit: Payer: Medicare Other | Admitting: Cardiology

## 2019-03-23 ENCOUNTER — Telehealth (INDEPENDENT_AMBULATORY_CARE_PROVIDER_SITE_OTHER): Payer: Medicare Other | Admitting: Cardiology

## 2019-03-23 ENCOUNTER — Encounter: Payer: Self-pay | Admitting: Cardiology

## 2019-03-23 ENCOUNTER — Ambulatory Visit: Payer: Medicare Other | Admitting: Cardiology

## 2019-03-23 VITALS — BP 100/64 | HR 88 | Ht 61.0 in | Wt 182.0 lb

## 2019-03-23 DIAGNOSIS — Z1329 Encounter for screening for other suspected endocrine disorder: Secondary | ICD-10-CM

## 2019-03-23 DIAGNOSIS — Z1321 Encounter for screening for nutritional disorder: Secondary | ICD-10-CM

## 2019-03-23 DIAGNOSIS — Z13220 Encounter for screening for lipoid disorders: Secondary | ICD-10-CM

## 2019-03-23 DIAGNOSIS — Z86711 Personal history of pulmonary embolism: Secondary | ICD-10-CM

## 2019-03-23 DIAGNOSIS — I10 Essential (primary) hypertension: Secondary | ICD-10-CM

## 2019-03-23 DIAGNOSIS — I4892 Unspecified atrial flutter: Secondary | ICD-10-CM

## 2019-03-23 NOTE — Progress Notes (Signed)
Virtual Visit via Telephone Note   This visit type was conducted due to national recommendations for restrictions regarding the COVID-19 Pandemic (e.g. social distancing) in an effort to limit this patient's exposure and mitigate transmission in our community.  Due to her co-morbid illnesses, this patient is at least at moderate risk for complications without adequate follow up.  This format is felt to be most appropriate for this patient at this time.  The patient did not have access to video technology/had technical difficulties with video requiring transitioning to audio format only (telephone).  All issues noted in this document were discussed and addressed.  No physical exam could be performed with this format.  Please refer to the patient's chart for her  consent to telehealth for [facility].   Date:  03/23/2019   ID:  Kerri Rogers, DOB Oct 08, 1934, MRN [id number]  Patient Location: Home Provider Location: Office  PCP:  Lucianne LeiUppin, Nina, MD  Cardiologist:  No primary care provider on file.  Electrophysiologist:  None   Evaluation Performed:  Follow-Up Visit  Chief Complaint: Atrial fibrillation and flutter  History of Present Illness:    Kerri Rogers is a 84 y.o. female with past medical history of atrial fibrillation and  essential hypertension.  She denies any problems at this time and takes care of activities of daily living.  No chest pain orthopnea or PND.  The patient mentions to me that she is walking on a regular basis.  It has been quite some time since she has had blood at the time of my evaluation, the patient is alert awake oriented and in no distress.  The patient does not have symptoms concerning for COVID-19 infection (fever, chills, cough, or new shortness of breath).    Past Medical History:  Diagnosis Date  . Asthma with bronchitis and status asthmaticus   . Atrial fibrillation (HCC)   . B12 deficiency   . Bladder spasm   . CHF (congestive heart failure) (HCC)    . Chronic constipation   . COPD (chronic obstructive pulmonary disease) (HCC)   . Edema   . GERD (gastroesophageal reflux disease)   . Gout   . Herniated lumbar intervertebral disc   . Hypertension   . Insomnia   . Long term (current) use of anticoagulants   . Overactive bladder   . Peripheral autonomic neuropathy   . Pre-ulcerative calluses   . Skin rash   . Stasis dermatitis   . Ventral hernia   . Vitamin D deficiency    Past Surgical History:  Procedure Laterality Date  . ABDOMINAL HYSTERECTOMY    . BLADDER SURGERY    . CATARACT EXTRACTION    . REPLACEMENT TOTAL KNEE BILATERAL    . TONSILLECTOMY       Current Meds  Medication Sig  . B Complex Vitamins (VITAMIN B COMPLEX PO) Take 1 tablet by mouth 3 (three) times a week.   . digoxin (LANOXIN) 0.125 MG tablet Take 1 tablet (0.125 mg total) by mouth daily.  . DOCOSAHEXAENOIC ACID PO Take 1,200 mg daily by mouth.   . ferrous sulfate 324 (65 Fe) MG TBEC Take 324 mg by mouth daily.   . furosemide (LASIX) 20 MG tablet Take 40 mg by mouth daily.   Marland Kitchen. gabapentin (NEURONTIN) 800 MG tablet Take 800 mg daily by mouth.   Marland Kitchen. MAGNESIUM PO Take 250 mg by mouth daily.   . metoprolol tartrate (LOPRESSOR) 25 MG tablet Take 0.5 tablets (12.5 mg total) by mouth 2 (  two) times daily.  . mirabegron ER (MYRBETRIQ) 50 MG TB24 tablet Take 50 mg by mouth daily.  . montelukast (SINGULAIR) 10 MG tablet Take 10 mg by mouth daily.  . Multiple Vitamin (MULTIVITAMIN) tablet Take 1 tablet by mouth daily.  Marland Kitchen oxybutynin (DITROPAN-XL) 10 MG 24 hr tablet Take 10 mg by mouth daily.  . Potassium 99 MG TABS Take 99 mg daily by mouth.   . rivaroxaban (XARELTO) 20 MG TABS tablet Take 1 tablet (20 mg total) by mouth daily with supper.  . traMADol (ULTRAM) 50 MG tablet Take 50 mg by mouth every 6 (six) hours as needed.     Allergies:   Flagyl [metronidazole], Penicillins, and Sulfamethoxazole-trimethoprim   Social History   Tobacco Use  . Smoking status:  Never Smoker  . Smokeless tobacco: Never Used  Substance Use Topics  . Alcohol use: No  . Drug use: No     Family Hx: The patient's family history includes Anxiety disorder in her sister; Asthma in her sister; Atrial fibrillation in her sister; Diabetes in her mother; GER disease in her sister; Heart failure in her mother; Hypertension in her brother. There is no history of Thyroid disease.  ROS:   Please see the history of present illness.    As mentioned above All other systems reviewed and are negative.   Prior CV studies:   The following studies were reviewed today:  None significant  Labs/Other Tests and Data Reviewed:    EKG:  No ECG reviewed.  Recent Labs: 05/25/2018: TSH 0.997 06/01/2018: BUN 20; Creatinine, Ser 1.17; Potassium 4.0; Sodium 141   Recent Lipid Panel No results found for: CHOL, TRIG, HDL, CHOLHDL, LDLCALC, LDLDIRECT  Wt Readings from Last 3 Encounters:  03/23/19 182 lb (82.6 kg)  07/05/18 189 lb (85.7 kg)  06/01/18 191 lb 3.2 oz (86.7 kg)     Objective:    Vital Signs:  BP 100/64   Pulse 88   Ht 5\' 1"  (1.549 m)   Wt 182 lb (82.6 kg)   BMI 34.39 kg/m    VITAL SIGNS:  reviewed  ASSESSMENT & PLAN:    1. Paroxysmal atrial fibrillation:I discussed with the patient atrial fibrillation, disease process. Management and therapy including rate and rhythm control, anticoagulation benefits and potential risks were discussed extensively with the patient. Patient had multiple questions which were answered to patient's satisfaction. 2. Essential hypertension: Blood pressure is stable 3. Mixed dyslipidemia: Diet was discussed 4. Patient will be back in the next few days to her office fasting blood work including Chem-7, CBC, liver lipid check, TSH and a vitamin D level.  We will also do an EKG 5. Patient will be seen in follow-up appointment in 6 months or earlier if the patient has any concerns   COVID-19 Education: The signs and symptoms of COVID-19  were discussed with the patient and how to seek care for testing (follow up with PCP or arrange E-visit).  The importance of social distancing was discussed today.  Time:   Today, I have spent 12 minutes with the patient with telehealth technology discussing the above problems.     Medication Adjustments/Labs and Tests Ordered: Current medicines are reviewed at length with the patient today.  Concerns regarding medicines are outlined above.   Tests Ordered: No orders of the defined types were placed in this encounter.   Medication Changes: No orders of the defined types were placed in this encounter.   Follow Up:  Virtual Visit or In Person  in 4 month(s)  Signed, Garwin Brothers, MD  03/23/2019 11:06 AM    [facility]

## 2019-03-23 NOTE — Addendum Note (Signed)
Addended by: Beckey Rutter on: 03/23/2019 12:18 PM   Modules accepted: Orders

## 2019-04-21 ENCOUNTER — Other Ambulatory Visit: Payer: Self-pay

## 2019-04-21 ENCOUNTER — Ambulatory Visit (INDEPENDENT_AMBULATORY_CARE_PROVIDER_SITE_OTHER): Payer: Medicare Other | Admitting: Cardiology

## 2019-04-21 DIAGNOSIS — I10 Essential (primary) hypertension: Secondary | ICD-10-CM | POA: Diagnosis not present

## 2019-04-21 DIAGNOSIS — I4892 Unspecified atrial flutter: Secondary | ICD-10-CM | POA: Diagnosis not present

## 2019-04-21 DIAGNOSIS — Z86711 Personal history of pulmonary embolism: Secondary | ICD-10-CM

## 2019-04-21 NOTE — Progress Notes (Signed)
EKG revealed atrial fibrillation and nonspecific ST-T changes.  Patient has not taken her medications this morning.  I discussed my findings with the patient at length and questions were answered to her satisfaction.

## 2019-04-21 NOTE — Progress Notes (Deleted)
Cardiology Office Note:    Date:  04/21/2019   ID:  Kerri Rogers, DOB 06/25/1935, MRN [id number]  PCP:  Nicholos Johns, MD  Cardiologist:  Jenean Lindau, MD   Referring MD: Nicholos Johns, MD    ASSESSMENT:    1. Atrial flutter, paroxysmal ([place])   2. Essential hypertension   3. History of pulmonary embolism    PLAN:    In order of problems listed above:  1. ***   Medication Adjustments/Labs and Tests Ordered: Current medicines are reviewed at length with the patient today.  Concerns regarding medicines are outlined above.  Orders Placed This Encounter  Procedures  . EKG 12-Lead   No orders of the defined types were placed in this encounter.    No chief complaint on file.    History of Present Illness:    Kerri Rogers is a 84 y.o. female ***  Past Medical History:  Diagnosis Date  . Asthma with bronchitis and status asthmaticus   . Atrial fibrillation ([place])   . B12 deficiency   . Bladder spasm   . CHF (congestive heart failure) ([place])   . Chronic constipation   . COPD (chronic obstructive pulmonary disease) ([place])   . Edema   . GERD (gastroesophageal reflux disease)   . Gout   . Herniated lumbar intervertebral disc   . Hypertension   . Insomnia   . Long term (current) use of anticoagulants   . Overactive bladder   . Peripheral autonomic neuropathy   . Pre-ulcerative calluses   . Skin rash   . Stasis dermatitis   . Ventral hernia   . Vitamin D deficiency     Past Surgical History:  Procedure Laterality Date  . ABDOMINAL HYSTERECTOMY    . BLADDER SURGERY    . CATARACT EXTRACTION    . REPLACEMENT TOTAL KNEE BILATERAL    . TONSILLECTOMY      Current Medications: No outpatient medications have been marked as taking for the 04/21/19 encounter (Office Visit) with Felicie Kocher, Reita Cliche, MD.     Allergies:   Flagyl [metronidazole], Penicillins, and Sulfamethoxazole-trimethoprim   Social History   Socioeconomic History  . Marital status: Married    Spouse  name: Not on file  . Number of children: Not on file  . Years of education: Not on file  . Highest education level: Not on file  Occupational History  . Not on file  Social Needs  . Financial resource strain: Not on file  . Food insecurity    Worry: Not on file    Inability: Not on file  . Transportation needs    Medical: Not on file    Non-medical: Not on file  Tobacco Use  . Smoking status: Never Smoker  . Smokeless tobacco: Never Used  Substance and Sexual Activity  . Alcohol use: No  . Drug use: No  . Sexual activity: Not on file  Lifestyle  . Physical activity    Days per week: Not on file    Minutes per session: Not on file  . Stress: Not on file  Relationships  . Social Herbalist on phone: Not on file    Gets together: Not on file    Attends religious service: Not on file    Active member of club or organization: Not on file    Attends meetings of clubs or organizations: Not on file    Relationship status: Not on file  Other Topics Concern  . Not on  file  Social History Narrative  . Not on file     Family History: The patient's family history includes Anxiety disorder in her sister; Asthma in her sister; Atrial fibrillation in her sister; Diabetes in her mother; GER disease in her sister; Heart failure in her mother; Hypertension in her brother. There is no history of Thyroid disease.  ROS:   Please see the history of present illness.    All other systems reviewed and are negative.  EKGs/Labs/Other Studies Reviewed:    The following studies were reviewed today: ***   Recent Labs: 05/25/2018: TSH 0.997 06/01/2018: BUN 20; Creatinine, Ser 1.17; Potassium 4.0; Sodium 141  Recent Lipid Panel No results found for: CHOL, TRIG, HDL, CHOLHDL, VLDL, LDLCALC, LDLDIRECT  Physical Exam:    VS:  There were no vitals taken for this visit.    Wt Readings from Last 3 Encounters:  03/23/19 182 lb (82.6 kg)  07/05/18 189 lb (85.7 kg)  06/01/18 191 lb  3.2 oz (86.7 kg)     GEN: Patient is in no acute distress HEENT: Normal NECK: No JVD; No carotid bruits LYMPHATICS: No lymphadenopathy CARDIAC: Hear sounds regular, 2/6 systolic murmur at the apex. RESPIRATORY:  Clear to auscultation without rales, wheezing or rhonchi  ABDOMEN: Soft, non-tender, non-distended MUSCULOSKELETAL:  No edema; No deformity  SKIN: Warm and dry NEUROLOGIC:  Alert and oriented x 3 PSYCHIATRIC:  Normal affect   Signed, Garwin Brothers, MD  04/21/2019 8:46 AM    [facility]

## 2019-04-22 ENCOUNTER — Ambulatory Visit: Payer: Medicare Other | Admitting: Cardiology

## 2019-04-22 LAB — HEPATIC FUNCTION PANEL
ALT: 18 IU/L (ref 0–32)
AST: 21 IU/L (ref 0–40)
Albumin: 4.1 g/dL (ref 3.6–4.6)
Alkaline Phosphatase: 71 IU/L (ref 39–117)
Bilirubin Total: 0.6 mg/dL (ref 0.0–1.2)
Bilirubin, Direct: 0.18 mg/dL (ref 0.00–0.40)
Total Protein: 6.2 g/dL (ref 6.0–8.5)

## 2019-04-22 LAB — CBC
Hematocrit: 37 % (ref 34.0–46.6)
Hemoglobin: 11.9 g/dL (ref 11.1–15.9)
MCH: 29 pg (ref 26.6–33.0)
MCHC: 32.2 g/dL (ref 31.5–35.7)
MCV: 90 fL (ref 79–97)
Platelets: 221 10*3/uL (ref 150–450)
RBC: 4.1 x10E6/uL (ref 3.77–5.28)
RDW: 12.7 % (ref 11.7–15.4)
WBC: 8.2 10*3/uL (ref 3.4–10.8)

## 2019-04-22 LAB — BASIC METABOLIC PANEL
BUN/Creatinine Ratio: 16 (ref 12–28)
BUN: 18 mg/dL (ref 8–27)
CO2: 24 mmol/L (ref 20–29)
Calcium: 9.9 mg/dL (ref 8.7–10.3)
Chloride: 104 mmol/L (ref 96–106)
Creatinine, Ser: 1.12 mg/dL — ABNORMAL HIGH (ref 0.57–1.00)
GFR calc Af Amer: 53 mL/min/{1.73_m2} — ABNORMAL LOW (ref >59)
GFR calc non Af Amer: 46 mL/min/{1.73_m2} — ABNORMAL LOW (ref >59)
Glucose: 98 mg/dL (ref 65–99)
Potassium: 4.3 mmol/L (ref 3.5–5.2)
Sodium: 141 mmol/L (ref 134–144)

## 2019-04-22 LAB — LIPID PANEL
Chol/HDL Ratio: 3.8 ratio (ref 0.0–4.4)
Cholesterol, Total: 127 mg/dL (ref 100–199)
HDL: 33 mg/dL — ABNORMAL LOW (ref >39)
LDL Chol Calc (NIH): 66 mg/dL (ref 0–99)
Triglycerides: 164 mg/dL — ABNORMAL HIGH (ref 0–149)
VLDL Cholesterol Cal: 28 mg/dL (ref 5–40)

## 2019-04-22 LAB — DIGOXIN LEVEL: Digoxin, Serum: 0.6 ng/mL (ref 0.5–0.9)

## 2019-04-22 LAB — TSH: TSH: 2.51 u[IU]/mL (ref 0.450–4.500)

## 2019-04-22 LAB — VITAMIN D 25 HYDROXY (VIT D DEFICIENCY, FRACTURES): Vit D, 25-Hydroxy: 54.7 ng/mL (ref 30.0–100.0)

## 2019-05-04 ENCOUNTER — Telehealth: Payer: Self-pay | Admitting: Cardiology

## 2019-05-04 ENCOUNTER — Telehealth: Payer: Self-pay

## 2019-05-04 NOTE — Telephone Encounter (Signed)
-----   Message from Jenean Lindau, MD sent at 04/22/2019  8:15 AM EDT ----- The results of the study is unremarkable. Please inform patient. I will discuss in detail at next appointment. Cc  primary care/referring physician Jenean Lindau, MD 04/22/2019 8:15 AM

## 2019-05-04 NOTE — Telephone Encounter (Signed)
°*  STAT* If patient is at the pharmacy, call can be transferred to refill team.   1. Which medications need to be refilled? (please list name of each medication and dose if known) Digoxin takes 1 daily   2. Which pharmacy/location (including street and city if local pharmacy) is medication to be sent to? [address]  3. Do they need a 30 day or 90 day supply?90   Patient has follow up appt in Nov.

## 2019-05-04 NOTE — Telephone Encounter (Signed)
Result relayed, copy to Dr.Uppin

## 2019-05-06 ENCOUNTER — Other Ambulatory Visit: Payer: Self-pay

## 2019-05-06 MED ORDER — DIGOXIN 125 MCG PO TABS: 0.1250 mg | ORAL_TABLET | Freq: Every day | ORAL | 1 refills | Status: DC

## 2019-06-21 ENCOUNTER — Encounter: Payer: Self-pay | Admitting: Cardiology

## 2019-06-21 ENCOUNTER — Other Ambulatory Visit: Payer: Self-pay

## 2019-06-21 ENCOUNTER — Ambulatory Visit (INDEPENDENT_AMBULATORY_CARE_PROVIDER_SITE_OTHER): Payer: Medicare Other | Admitting: Cardiology

## 2019-06-21 VITALS — BP 130/64 | HR 48 | Ht 61.0 in | Wt 185.0 lb

## 2019-06-21 DIAGNOSIS — I10 Essential (primary) hypertension: Secondary | ICD-10-CM | POA: Diagnosis not present

## 2019-06-21 DIAGNOSIS — Z86711 Personal history of pulmonary embolism: Secondary | ICD-10-CM | POA: Diagnosis not present

## 2019-06-21 DIAGNOSIS — I4892 Unspecified atrial flutter: Secondary | ICD-10-CM | POA: Diagnosis not present

## 2019-06-21 MED ORDER — RIVAROXABAN 20 MG PO TABS: 20.0000 mg | ORAL_TABLET | Freq: Every day | ORAL | 1 refills | Status: DC

## 2019-06-21 NOTE — Patient Instructions (Signed)

## 2019-06-21 NOTE — Progress Notes (Signed)
Cardiology Office Note:    Date:  06/21/2019   ID:  Kerri Rogers, DOB 1934/11/30, MRN [id number]  PCP:  Kerri Lei, MD  Cardiologist:  Kerri Brothers, MD   Referring MD: Kerri Lei, MD    ASSESSMENT:    1. Essential hypertension   2. Atrial flutter, paroxysmal (HCC)   3. History of pulmonary embolism    PLAN:    In order of problems listed above:  1. Atrial fibrillation:I discussed with the patient atrial fibrillation, disease process. Management and therapy including rate and rhythm control, anticoagulation benefits and potential risks were discussed extensively with the patient. Patient had multiple questions which were answered to patient's satisfaction. 2. Essential hypertension: Blood pressure stable 3. History of pulmonary embolism: Patient is on anticoagulation. 4. She plans to get complete blood work done this weekend at her primary care physician's office and send [place] a copy. 5. Patient will be seen in follow-up appointment in 6 months or earlier if the patient has any concerns    Medication Adjustments/Labs and Tests Ordered: Current medicines are reviewed at length with the patient today.  Concerns regarding medicines are outlined above.  No orders of the defined types were placed in this encounter.  No orders of the defined types were placed in this encounter.    Chief Complaint  Patient presents with  . Follow-up     History of Present Illness:    Kerri Rogers is a 84 y.o. female.  Patient has past medical history of atrial fibrillation and essential hypertension.  She also has history of pulmonary embolism.  She denies any problems at this time and takes care of activities of daily living.  No chest pain orthopnea or PND.  At the time of my evaluation, the patient is alert awake oriented and in no distress.  Past Medical History:  Diagnosis Date  . Asthma with bronchitis and status asthmaticus   . Atrial fibrillation (HCC)   . B12 deficiency   .  Bladder spasm   . CHF (congestive heart failure) (HCC)   . Chronic constipation   . COPD (chronic obstructive pulmonary disease) (HCC)   . Edema   . GERD (gastroesophageal reflux disease)   . Gout   . Herniated lumbar intervertebral disc   . Hypertension   . Insomnia   . Long term (current) use of anticoagulants   . Overactive bladder   . Peripheral autonomic neuropathy   . Pre-ulcerative calluses   . Skin rash   . Stasis dermatitis   . Ventral hernia   . Vitamin D deficiency     Past Surgical History:  Procedure Laterality Date  . ABDOMINAL HYSTERECTOMY    . BLADDER SURGERY    . CATARACT EXTRACTION    . REPLACEMENT TOTAL KNEE BILATERAL    . TONSILLECTOMY      Current Medications: Current Meds  Medication Sig  . B Complex Vitamins (VITAMIN B COMPLEX PO) Take 1 tablet by mouth 3 (three) times a week.   . digoxin (LANOXIN) 0.125 MG tablet Take 1 tablet (0.125 mg total) by mouth daily.  . DOCOSAHEXAENOIC ACID PO Take 1,200 mg daily by mouth.   . ferrous sulfate 324 (65 Fe) MG TBEC Take 324 mg by mouth daily.   . furosemide (LASIX) 20 MG tablet Take 40 mg by mouth daily.   Marland Kitchen gabapentin (NEURONTIN) 800 MG tablet Take 800 mg daily by mouth.   Marland Kitchen MAGNESIUM PO Take 250 mg by mouth daily.   . metoprolol  tartrate (LOPRESSOR) 25 MG tablet Take 0.5 tablets (12.5 mg total) by mouth 2 (two) times daily.  . montelukast (SINGULAIR) 10 MG tablet Take 10 mg by mouth daily.  . Multiple Vitamin (MULTIVITAMIN) tablet Take 1 tablet by mouth daily.  Marland Kitchen oxybutynin (DITROPAN-XL) 10 MG 24 hr tablet Take 10 mg by mouth daily.  . Potassium 99 MG TABS Take 99 mg daily by mouth.   . rivaroxaban (XARELTO) 20 MG TABS tablet Take 1 tablet (20 mg total) by mouth daily with supper.  . traMADol (ULTRAM) 50 MG tablet Take 50 mg by mouth as needed.   . [DISCONTINUED] mirabegron ER (MYRBETRIQ) 50 MG TB24 tablet Take 50 mg by mouth daily.     Allergies:   Flagyl [metronidazole], Penicillins, and  Sulfamethoxazole-trimethoprim   Social History   Socioeconomic History  . Marital status: Married    Spouse name: Not on file  . Number of children: Not on file  . Years of education: Not on file  . Highest education level: Not on file  Occupational History  . Not on file  Social Needs  . Financial resource strain: Not on file  . Food insecurity    Worry: Not on file    Inability: Not on file  . Transportation needs    Medical: Not on file    Non-medical: Not on file  Tobacco Use  . Smoking status: Never Smoker  . Smokeless tobacco: Never Used  Substance and Sexual Activity  . Alcohol use: No  . Drug use: No  . Sexual activity: Not on file  Lifestyle  . Physical activity    Days per week: Not on file    Minutes per session: Not on file  . Stress: Not on file  Relationships  . Social Musician on phone: Not on file    Gets together: Not on file    Attends religious service: Not on file    Active member of club or organization: Not on file    Attends meetings of clubs or organizations: Not on file    Relationship status: Not on file  Other Topics Concern  . Not on file  Social History Narrative  . Not on file     Family History: The patient's family history includes Anxiety disorder in her sister; Asthma in her sister; Atrial fibrillation in her sister; Diabetes in her mother; GER disease in her sister; Heart failure in her mother; Hypertension in her brother. There is no history of Thyroid disease.  ROS:   Please see the history of present illness.    All other systems reviewed and are negative.  EKGs/Labs/Other Studies Reviewed:    The following studies were reviewed today: I discussed my findings with the patient at extensive length   Recent Labs: 04/21/2019: ALT 18; BUN 18; Creatinine, Ser 1.12; Hemoglobin 11.9; Platelets 221; Potassium 4.3; Sodium 141; TSH 2.510  Recent Lipid Panel    Component Value Date/Time   CHOL 127 04/21/2019 0917    TRIG 164 (H) 04/21/2019 0917   HDL 33 (L) 04/21/2019 0917   CHOLHDL 3.8 04/21/2019 0917   LDLCALC 66 04/21/2019 0917    Physical Exam:    VS:  BP 130/64 (BP Location: Left Arm, Patient Position: Sitting, Cuff Size: Normal)   Pulse (!) 48   Ht 5\' 1"  (1.549 m)   Wt 185 lb (83.9 kg)   SpO2 96%   BMI 34.96 kg/m     Wt Readings from Last 3  Encounters:  06/21/19 185 lb (83.9 kg)  03/23/19 182 lb (82.6 kg)  07/05/18 189 lb (85.7 kg)     GEN: Patient is in no acute distress HEENT: Normal NECK: No JVD; No carotid bruits LYMPHATICS: No lymphadenopathy CARDIAC: Hear sounds regular, 2/6 systolic murmur at the apex. RESPIRATORY:  Clear to auscultation without rales, wheezing or rhonchi  ABDOMEN: Soft, non-tender, non-distended MUSCULOSKELETAL:  No edema; No deformity  SKIN: Warm and dry NEUROLOGIC:  Alert and oriented x 3 PSYCHIATRIC:  Normal affect   Signed, Jenean Lindau, MD  06/21/2019 10:51 AM    [place]

## 2019-07-27 ENCOUNTER — Other Ambulatory Visit: Payer: Self-pay | Admitting: Cardiology

## 2019-10-13 DIAGNOSIS — M159 Polyosteoarthritis, unspecified: Secondary | ICD-10-CM | POA: Diagnosis not present

## 2019-10-13 DIAGNOSIS — K219 Gastro-esophageal reflux disease without esophagitis: Secondary | ICD-10-CM | POA: Diagnosis not present

## 2019-10-13 DIAGNOSIS — G629 Polyneuropathy, unspecified: Secondary | ICD-10-CM | POA: Diagnosis not present

## 2019-10-13 DIAGNOSIS — E559 Vitamin D deficiency, unspecified: Secondary | ICD-10-CM | POA: Diagnosis not present

## 2019-10-13 DIAGNOSIS — I872 Venous insufficiency (chronic) (peripheral): Secondary | ICD-10-CM | POA: Diagnosis not present

## 2019-10-13 DIAGNOSIS — M7989 Other specified soft tissue disorders: Secondary | ICD-10-CM | POA: Diagnosis not present

## 2019-10-13 DIAGNOSIS — I10 Essential (primary) hypertension: Secondary | ICD-10-CM | POA: Diagnosis not present

## 2019-10-13 DIAGNOSIS — G9009 Other idiopathic peripheral autonomic neuropathy: Secondary | ICD-10-CM | POA: Diagnosis not present

## 2019-10-13 DIAGNOSIS — J309 Allergic rhinitis, unspecified: Secondary | ICD-10-CM | POA: Diagnosis not present

## 2019-12-15 ENCOUNTER — Other Ambulatory Visit: Payer: Self-pay | Admitting: Cardiology

## 2019-12-15 MED ORDER — RIVAROXABAN 20 MG PO TABS: 20.0000 mg | ORAL_TABLET | Freq: Every day | ORAL | 1 refills | Status: DC

## 2019-12-15 NOTE — Telephone Encounter (Signed)
Refill sent in per request.  

## 2019-12-15 NOTE — Telephone Encounter (Signed)
*  STAT* If patient is at the pharmacy, call can be transferred to refill team.   1. Which medications need to be refilled? (please list name of each medication and dose if known) rivaroxaban (XARELTO) 20 MG TABS tablet  2. Which pharmacy/location (including street and city if local pharmacy) is medication to be sent to? [facility] [address] [facility] - [place], [place] - [address]  3. Do they need a 30 day or 90 day supply? 90 day

## 2020-01-03 ENCOUNTER — Other Ambulatory Visit: Payer: Self-pay

## 2020-01-03 ENCOUNTER — Encounter: Payer: Self-pay | Admitting: Cardiology

## 2020-01-03 ENCOUNTER — Ambulatory Visit: Payer: Medicare PPO | Admitting: Cardiology

## 2020-01-03 VITALS — BP 134/70 | HR 74 | Ht 61.0 in | Wt 178.0 lb

## 2020-01-03 DIAGNOSIS — Z86711 Personal history of pulmonary embolism: Secondary | ICD-10-CM

## 2020-01-03 DIAGNOSIS — I4892 Unspecified atrial flutter: Secondary | ICD-10-CM

## 2020-01-03 DIAGNOSIS — I10 Essential (primary) hypertension: Secondary | ICD-10-CM

## 2020-01-03 NOTE — Patient Instructions (Signed)

## 2020-01-03 NOTE — Progress Notes (Signed)
Cardiology Office Note:    Date:  01/03/2020   ID:  Kerri Rogers, DOB [birth year], MRN [id number]  PCP:  Lucianne Lei, MD  Cardiologist:  Garwin Brothers, MD   Referring MD: Lucianne Lei, MD    ASSESSMENT:    1. Atrial flutter, paroxysmal (HCC)   2. Essential hypertension   3. History of pulmonary embolism    PLAN:    In order of problems listed above:  1. Primary prevention stressed with the patient.  Importance of compliance with diet and medication stressed and she vocalized understanding. 2. Atrial flutter:I discussed with the patient atrial flutter, disease process. Management and therapy including rate and rhythm control, anticoagulation benefits and potential risks were discussed extensively with the patient. Patient had multiple questions which were answered to patient's satisfaction. 3. Essential hypertension: Blood pressure stable 4. History of pulmonary embolism: On anticoagulation.  Stable from this perspective. 5. Patient will be seen in follow-up appointment in 6 months or earlier if the patient has any concerns    Medication Adjustments/Labs and Tests Ordered: Current medicines are reviewed at length with the patient today.  Concerns regarding medicines are outlined above.  No orders of the defined types were placed in this encounter.  No orders of the defined types were placed in this encounter.    Chief Complaint  Patient presents with  . Follow-up     History of Present Illness:    Kerri Rogers is a 85 y.o. female.  Patient has past medical history of atrial flutter, essential hypertension and history of pulmonary embolism.  She denies any problems at this time and takes care of activities of daily living.  She ambulates with a cane.  No chest pain orthopnea or PND.  At the time of my evaluation, the patient is alert awake oriented and in no distress.  Past Medical History:  Diagnosis Date  . Asthma with bronchitis and status asthmaticus   . Atrial  fibrillation (HCC)   . Atrial flutter, paroxysmal (HCC) 03/01/2015  . B12 deficiency   . Bladder spasm   . CHF (congestive heart failure) (HCC)   . Chronic constipation   . COPD (chronic obstructive pulmonary disease) (HCC)   . Edema   . Essential hypertension 03/01/2015  . GERD (gastroesophageal reflux disease)   . Gout   . Herniated lumbar intervertebral disc   . History of pulmonary embolism 03/01/2015  . Hypertension   . Insomnia   . Long term (current) use of anticoagulants   . Overactive bladder   . Peripheral autonomic neuropathy   . Pre-ulcerative calluses   . Skin rash   . Stasis dermatitis   . Ventral hernia   . Vitamin D deficiency     Past Surgical History:  Procedure Laterality Date  . ABDOMINAL HYSTERECTOMY    . BLADDER SURGERY    . CATARACT EXTRACTION    . REPLACEMENT TOTAL KNEE BILATERAL    . TONSILLECTOMY      Current Medications: Current Meds  Medication Sig  . B Complex Vitamins (VITAMIN B COMPLEX PO) Take 1 tablet by mouth 3 (three) times a week.   . digoxin (LANOXIN) 0.125 MG tablet TAKE 1 TABLET BY MOUTH ONCE (1) DAILY  . DOCOSAHEXAENOIC ACID PO Take 1,200 mg daily by mouth.   . ferrous sulfate 324 (65 Fe) MG TBEC Take 324 mg by mouth daily.   . furosemide (LASIX) 20 MG tablet Take 40 mg by mouth daily.   Marland Kitchen gabapentin (NEURONTIN) 800 MG tablet  Take 800 mg daily by mouth.   . latanoprost (XALATAN) 0.005 % ophthalmic solution 1 drop at bedtime.  Marland Kitchen MAGNESIUM PO Take 250 mg by mouth daily.   . montelukast (SINGULAIR) 10 MG tablet Take 10 mg by mouth daily.  . Multiple Vitamin (MULTIVITAMIN) tablet Take 1 tablet by mouth daily.  . Omega-3 Fatty Acids (KP FISH OIL) 1200 MG CAPS Take by mouth.  . oxybutynin (DITROPAN-XL) 10 MG 24 hr tablet Take 10 mg by mouth daily.  . Potassium 99 MG TABS Take 99 mg daily by mouth.   . pregabalin (LYRICA) 75 MG capsule Take 75 mg by mouth 2 (two) times daily.  . rivaroxaban (XARELTO) 20 MG TABS tablet Take 1 tablet (20  mg total) by mouth daily with supper.  . traMADol (ULTRAM) 50 MG tablet Take 50 mg by mouth as needed.      Allergies:   Flagyl [metronidazole], Penicillins, and Sulfamethoxazole-trimethoprim   Social History   Socioeconomic History  . Marital status: Married    Spouse name: Not on file  . Number of children: Not on file  . Years of education: Not on file  . Highest education level: Not on file  Occupational History  . Not on file  Tobacco Use  . Smoking status: Never Smoker  . Smokeless tobacco: Never Used  Substance and Sexual Activity  . Alcohol use: No  . Drug use: No  . Sexual activity: Not on file  Other Topics Concern  . Not on file  Social History Narrative  . Not on file   Social Determinants of Health   Financial Resource Strain:   . Difficulty of Paying Living Expenses:   Food Insecurity:   . Worried About Charity fundraiser in the Last Year:   . Arboriculturist in the Last Year:   Transportation Needs:   . Film/video editor (Medical):   Marland Kitchen Lack of Transportation (Non-Medical):   Physical Activity:   . Days of Exercise per Week:   . Minutes of Exercise per Session:   Stress:   . Feeling of Stress :   Social Connections:   . Frequency of Communication with Friends and Family:   . Frequency of Social Gatherings with Friends and Family:   . Attends Religious Services:   . Active Member of Clubs or Organizations:   . Attends Archivist Meetings:   Marland Kitchen Marital Status:      Family History: The patient's family history includes Anxiety disorder in her sister; Asthma in her sister; Atrial fibrillation in her sister; Diabetes in her mother; GER disease in her sister; Heart failure in her mother; Hypertension in her brother. There is no history of Thyroid disease.  ROS:   Please see the history of present illness.    All other systems reviewed and are negative.  EKGs/Labs/Other Studies Reviewed:    The following studies were reviewed  today: I discussed my findings with the patient at length including lab work done in November   Recent Labs: 04/21/2019: ALT 18; BUN 18; Creatinine, Ser 1.12; Hemoglobin 11.9; Platelets 221; Potassium 4.3; Sodium 141; TSH 2.510  Recent Lipid Panel    Component Value Date/Time   CHOL 127 04/21/2019 0917   TRIG 164 (H) 04/21/2019 0917   HDL 33 (L) 04/21/2019 0917   CHOLHDL 3.8 04/21/2019 0917   LDLCALC 66 04/21/2019 0917    Physical Exam:    VS:  BP 134/70   Pulse 74   Ht  5\' 1"  (1.549 m)   Wt 178 lb (80.7 kg)   SpO2 94%   BMI 33.63 kg/m     Wt Readings from Last 3 Encounters:  01/03/20 178 lb (80.7 kg)  06/21/19 185 lb (83.9 kg)  03/23/19 182 lb (82.6 kg)     GEN: Patient is in no acute distress HEENT: Normal NECK: No JVD; No carotid bruits LYMPHATICS: No lymphadenopathy CARDIAC: Hear sounds regular, 2/6 systolic murmur at the apex. RESPIRATORY:  Clear to auscultation without rales, wheezing or rhonchi  ABDOMEN: Soft, non-tender, non-distended MUSCULOSKELETAL:  No edema; No deformity  SKIN: Warm and dry NEUROLOGIC:  Alert and oriented x 3 PSYCHIATRIC:  Normal affect   Signed, 05/23/19, MD  01/03/2020 11:17 AM    [facility]

## 2020-01-09 DIAGNOSIS — J309 Allergic rhinitis, unspecified: Secondary | ICD-10-CM | POA: Diagnosis not present

## 2020-01-09 DIAGNOSIS — G9009 Other idiopathic peripheral autonomic neuropathy: Secondary | ICD-10-CM | POA: Diagnosis not present

## 2020-01-09 DIAGNOSIS — M7989 Other specified soft tissue disorders: Secondary | ICD-10-CM | POA: Diagnosis not present

## 2020-01-09 DIAGNOSIS — I872 Venous insufficiency (chronic) (peripheral): Secondary | ICD-10-CM | POA: Diagnosis not present

## 2020-01-09 DIAGNOSIS — K219 Gastro-esophageal reflux disease without esophagitis: Secondary | ICD-10-CM | POA: Diagnosis not present

## 2020-01-09 DIAGNOSIS — E559 Vitamin D deficiency, unspecified: Secondary | ICD-10-CM | POA: Diagnosis not present

## 2020-01-09 DIAGNOSIS — G629 Polyneuropathy, unspecified: Secondary | ICD-10-CM | POA: Diagnosis not present

## 2020-01-09 DIAGNOSIS — M159 Polyosteoarthritis, unspecified: Secondary | ICD-10-CM | POA: Diagnosis not present

## 2020-01-09 DIAGNOSIS — I10 Essential (primary) hypertension: Secondary | ICD-10-CM | POA: Diagnosis not present

## 2020-02-07 DIAGNOSIS — M19049 Primary osteoarthritis, unspecified hand: Secondary | ICD-10-CM | POA: Diagnosis not present

## 2020-02-07 DIAGNOSIS — M7989 Other specified soft tissue disorders: Secondary | ICD-10-CM | POA: Diagnosis not present

## 2020-02-11 DIAGNOSIS — I10 Essential (primary) hypertension: Secondary | ICD-10-CM | POA: Diagnosis not present

## 2020-02-11 DIAGNOSIS — I701 Atherosclerosis of renal artery: Secondary | ICD-10-CM | POA: Diagnosis not present

## 2020-02-11 DIAGNOSIS — Z743 Need for continuous supervision: Secondary | ICD-10-CM | POA: Diagnosis not present

## 2020-02-11 DIAGNOSIS — I5032 Chronic diastolic (congestive) heart failure: Secondary | ICD-10-CM | POA: Diagnosis not present

## 2020-02-11 DIAGNOSIS — R000 Tachycardia, unspecified: Secondary | ICD-10-CM | POA: Diagnosis not present

## 2020-02-11 DIAGNOSIS — E785 Hyperlipidemia, unspecified: Secondary | ICD-10-CM | POA: Diagnosis not present

## 2020-02-11 DIAGNOSIS — R0902 Hypoxemia: Secondary | ICD-10-CM | POA: Diagnosis not present

## 2020-02-11 DIAGNOSIS — I517 Cardiomegaly: Secondary | ICD-10-CM | POA: Diagnosis not present

## 2020-02-11 DIAGNOSIS — J9601 Acute respiratory failure with hypoxia: Secondary | ICD-10-CM | POA: Diagnosis not present

## 2020-02-11 DIAGNOSIS — I110 Hypertensive heart disease with heart failure: Secondary | ICD-10-CM | POA: Diagnosis not present

## 2020-02-11 DIAGNOSIS — M109 Gout, unspecified: Secondary | ICD-10-CM | POA: Diagnosis not present

## 2020-02-11 DIAGNOSIS — R05 Cough: Secondary | ICD-10-CM | POA: Diagnosis not present

## 2020-02-11 DIAGNOSIS — R0789 Other chest pain: Secondary | ICD-10-CM | POA: Diagnosis not present

## 2020-02-11 DIAGNOSIS — I4891 Unspecified atrial fibrillation: Secondary | ICD-10-CM | POA: Diagnosis not present

## 2020-02-11 DIAGNOSIS — R072 Precordial pain: Secondary | ICD-10-CM | POA: Diagnosis not present

## 2020-02-11 DIAGNOSIS — I499 Cardiac arrhythmia, unspecified: Secondary | ICD-10-CM | POA: Diagnosis not present

## 2020-02-11 DIAGNOSIS — R079 Chest pain, unspecified: Secondary | ICD-10-CM | POA: Diagnosis not present

## 2020-02-11 DIAGNOSIS — J690 Pneumonitis due to inhalation of food and vomit: Secondary | ICD-10-CM | POA: Diagnosis not present

## 2020-02-12 DIAGNOSIS — R079 Chest pain, unspecified: Secondary | ICD-10-CM | POA: Diagnosis not present

## 2020-02-12 DIAGNOSIS — J9601 Acute respiratory failure with hypoxia: Secondary | ICD-10-CM | POA: Diagnosis not present

## 2020-02-12 DIAGNOSIS — I10 Essential (primary) hypertension: Secondary | ICD-10-CM | POA: Diagnosis not present

## 2020-02-13 DIAGNOSIS — I10 Essential (primary) hypertension: Secondary | ICD-10-CM | POA: Diagnosis not present

## 2020-02-13 DIAGNOSIS — R079 Chest pain, unspecified: Secondary | ICD-10-CM | POA: Diagnosis not present

## 2020-02-13 DIAGNOSIS — J9601 Acute respiratory failure with hypoxia: Secondary | ICD-10-CM | POA: Diagnosis not present

## 2020-02-21 DIAGNOSIS — M436 Torticollis: Secondary | ICD-10-CM | POA: Diagnosis not present

## 2020-02-21 DIAGNOSIS — Z09 Encounter for follow-up examination after completed treatment for conditions other than malignant neoplasm: Secondary | ICD-10-CM | POA: Diagnosis not present

## 2020-03-06 DIAGNOSIS — F419 Anxiety disorder, unspecified: Secondary | ICD-10-CM | POA: Diagnosis not present

## 2020-03-06 DIAGNOSIS — J40 Bronchitis, not specified as acute or chronic: Secondary | ICD-10-CM | POA: Diagnosis not present

## 2020-03-06 DIAGNOSIS — N390 Urinary tract infection, site not specified: Secondary | ICD-10-CM | POA: Diagnosis not present

## 2020-03-12 ENCOUNTER — Telehealth: Payer: Self-pay

## 2020-03-12 MED ORDER — METOPROLOL TARTRATE 25 MG PO TABS: 12.5000 mg | ORAL_TABLET | Freq: Two times a day (BID) | ORAL | 0 refills | Status: DC

## 2020-03-12 NOTE — Telephone Encounter (Signed)
Refill sent in for Metoprolol Tartrate 25 mg to [facility]

## 2020-03-21 DIAGNOSIS — Z139 Encounter for screening, unspecified: Secondary | ICD-10-CM | POA: Diagnosis not present

## 2020-03-21 DIAGNOSIS — Z0000 Encounter for general adult medical examination without abnormal findings: Secondary | ICD-10-CM | POA: Diagnosis not present

## 2020-03-21 DIAGNOSIS — Z9181 History of falling: Secondary | ICD-10-CM | POA: Diagnosis not present

## 2020-03-21 DIAGNOSIS — Z1331 Encounter for screening for depression: Secondary | ICD-10-CM | POA: Diagnosis not present

## 2020-03-21 DIAGNOSIS — Z6835 Body mass index (BMI) 35.0-35.9, adult: Secondary | ICD-10-CM | POA: Diagnosis not present

## 2020-04-17 DIAGNOSIS — M7989 Other specified soft tissue disorders: Secondary | ICD-10-CM | POA: Diagnosis not present

## 2020-04-17 DIAGNOSIS — M47816 Spondylosis without myelopathy or radiculopathy, lumbar region: Secondary | ICD-10-CM | POA: Diagnosis not present

## 2020-04-17 DIAGNOSIS — I10 Essential (primary) hypertension: Secondary | ICD-10-CM | POA: Diagnosis not present

## 2020-04-17 DIAGNOSIS — Z79899 Other long term (current) drug therapy: Secondary | ICD-10-CM | POA: Diagnosis not present

## 2020-04-17 DIAGNOSIS — M7918 Myalgia, other site: Secondary | ICD-10-CM | POA: Diagnosis not present

## 2020-04-17 DIAGNOSIS — M159 Polyosteoarthritis, unspecified: Secondary | ICD-10-CM | POA: Diagnosis not present

## 2020-04-17 DIAGNOSIS — Z043 Encounter for examination and observation following other accident: Secondary | ICD-10-CM | POA: Diagnosis not present

## 2020-04-17 DIAGNOSIS — I872 Venous insufficiency (chronic) (peripheral): Secondary | ICD-10-CM | POA: Diagnosis not present

## 2020-04-17 DIAGNOSIS — J309 Allergic rhinitis, unspecified: Secondary | ICD-10-CM | POA: Diagnosis not present

## 2020-04-17 DIAGNOSIS — M160 Bilateral primary osteoarthritis of hip: Secondary | ICD-10-CM | POA: Diagnosis not present

## 2020-04-17 DIAGNOSIS — M419 Scoliosis, unspecified: Secondary | ICD-10-CM | POA: Diagnosis not present

## 2020-04-17 DIAGNOSIS — G629 Polyneuropathy, unspecified: Secondary | ICD-10-CM | POA: Diagnosis not present

## 2020-04-17 DIAGNOSIS — K219 Gastro-esophageal reflux disease without esophagitis: Secondary | ICD-10-CM | POA: Diagnosis not present

## 2020-04-17 DIAGNOSIS — G9009 Other idiopathic peripheral autonomic neuropathy: Secondary | ICD-10-CM | POA: Diagnosis not present

## 2020-04-30 ENCOUNTER — Other Ambulatory Visit: Payer: Self-pay | Admitting: Cardiology

## 2020-04-30 DIAGNOSIS — H401131 Primary open-angle glaucoma, bilateral, mild stage: Secondary | ICD-10-CM | POA: Diagnosis not present

## 2020-06-07 DIAGNOSIS — K5909 Other constipation: Secondary | ICD-10-CM | POA: Diagnosis not present

## 2020-06-07 DIAGNOSIS — K625 Hemorrhage of anus and rectum: Secondary | ICD-10-CM | POA: Diagnosis not present

## 2020-06-07 DIAGNOSIS — I4891 Unspecified atrial fibrillation: Secondary | ICD-10-CM | POA: Diagnosis not present

## 2020-06-09 DIAGNOSIS — G629 Polyneuropathy, unspecified: Secondary | ICD-10-CM | POA: Diagnosis not present

## 2020-06-09 DIAGNOSIS — K5730 Diverticulosis of large intestine without perforation or abscess without bleeding: Secondary | ICD-10-CM | POA: Diagnosis not present

## 2020-06-09 DIAGNOSIS — K219 Gastro-esophageal reflux disease without esophagitis: Secondary | ICD-10-CM | POA: Diagnosis not present

## 2020-06-09 DIAGNOSIS — N390 Urinary tract infection, site not specified: Secondary | ICD-10-CM | POA: Diagnosis not present

## 2020-06-09 DIAGNOSIS — D62 Acute posthemorrhagic anemia: Secondary | ICD-10-CM | POA: Diagnosis not present

## 2020-06-09 DIAGNOSIS — N261 Atrophy of kidney (terminal): Secondary | ICD-10-CM | POA: Diagnosis not present

## 2020-06-09 DIAGNOSIS — M47816 Spondylosis without myelopathy or radiculopathy, lumbar region: Secondary | ICD-10-CM | POA: Diagnosis not present

## 2020-06-09 DIAGNOSIS — R933 Abnormal findings on diagnostic imaging of other parts of digestive tract: Secondary | ICD-10-CM | POA: Diagnosis not present

## 2020-06-09 DIAGNOSIS — I10 Essential (primary) hypertension: Secondary | ICD-10-CM | POA: Diagnosis not present

## 2020-06-09 DIAGNOSIS — I700 Atherosclerosis of aorta: Secondary | ICD-10-CM | POA: Diagnosis not present

## 2020-06-09 DIAGNOSIS — N170 Acute kidney failure with tubular necrosis: Secondary | ICD-10-CM | POA: Diagnosis not present

## 2020-06-09 DIAGNOSIS — K922 Gastrointestinal hemorrhage, unspecified: Secondary | ICD-10-CM | POA: Diagnosis not present

## 2020-06-09 DIAGNOSIS — K5731 Diverticulosis of large intestine without perforation or abscess with bleeding: Secondary | ICD-10-CM | POA: Diagnosis not present

## 2020-06-09 DIAGNOSIS — I482 Chronic atrial fibrillation, unspecified: Secondary | ICD-10-CM | POA: Diagnosis not present

## 2020-06-10 DIAGNOSIS — K5731 Diverticulosis of large intestine without perforation or abscess with bleeding: Secondary | ICD-10-CM | POA: Diagnosis not present

## 2020-06-10 DIAGNOSIS — N390 Urinary tract infection, site not specified: Secondary | ICD-10-CM | POA: Diagnosis not present

## 2020-06-10 DIAGNOSIS — N170 Acute kidney failure with tubular necrosis: Secondary | ICD-10-CM | POA: Diagnosis not present

## 2020-06-11 ENCOUNTER — Encounter: Payer: Self-pay | Admitting: Gastroenterology

## 2020-06-14 ENCOUNTER — Other Ambulatory Visit: Payer: Self-pay | Admitting: Unknown Physician Specialty

## 2020-06-14 DIAGNOSIS — R933 Abnormal findings on diagnostic imaging of other parts of digestive tract: Secondary | ICD-10-CM

## 2020-06-14 DIAGNOSIS — R195 Other fecal abnormalities: Secondary | ICD-10-CM | POA: Diagnosis not present

## 2020-06-14 DIAGNOSIS — D649 Anemia, unspecified: Secondary | ICD-10-CM | POA: Diagnosis not present

## 2020-06-14 DIAGNOSIS — K921 Melena: Secondary | ICD-10-CM | POA: Diagnosis not present

## 2020-06-19 DIAGNOSIS — K922 Gastrointestinal hemorrhage, unspecified: Secondary | ICD-10-CM | POA: Diagnosis not present

## 2020-06-19 DIAGNOSIS — Z23 Encounter for immunization: Secondary | ICD-10-CM | POA: Diagnosis not present

## 2020-06-19 DIAGNOSIS — I4891 Unspecified atrial fibrillation: Secondary | ICD-10-CM | POA: Diagnosis not present

## 2020-06-19 DIAGNOSIS — Z09 Encounter for follow-up examination after completed treatment for conditions other than malignant neoplasm: Secondary | ICD-10-CM | POA: Diagnosis not present

## 2020-06-20 DIAGNOSIS — D500 Iron deficiency anemia secondary to blood loss (chronic): Secondary | ICD-10-CM | POA: Diagnosis not present

## 2020-06-27 DIAGNOSIS — D500 Iron deficiency anemia secondary to blood loss (chronic): Secondary | ICD-10-CM | POA: Diagnosis not present

## 2020-07-02 ENCOUNTER — Ambulatory Visit: Payer: Medicare PPO | Admitting: Gastroenterology

## 2020-07-02 ENCOUNTER — Other Ambulatory Visit: Payer: Self-pay

## 2020-07-02 DIAGNOSIS — G4700 Insomnia, unspecified: Secondary | ICD-10-CM | POA: Insufficient documentation

## 2020-07-02 DIAGNOSIS — L84 Corns and callosities: Secondary | ICD-10-CM | POA: Insufficient documentation

## 2020-07-02 DIAGNOSIS — N3281 Overactive bladder: Secondary | ICD-10-CM | POA: Insufficient documentation

## 2020-07-02 DIAGNOSIS — G909 Disorder of the autonomic nervous system, unspecified: Secondary | ICD-10-CM | POA: Insufficient documentation

## 2020-07-02 DIAGNOSIS — N3289 Other specified disorders of bladder: Secondary | ICD-10-CM | POA: Insufficient documentation

## 2020-07-02 DIAGNOSIS — I10 Essential (primary) hypertension: Secondary | ICD-10-CM | POA: Insufficient documentation

## 2020-07-02 DIAGNOSIS — M109 Gout, unspecified: Secondary | ICD-10-CM | POA: Insufficient documentation

## 2020-07-02 DIAGNOSIS — M5126 Other intervertebral disc displacement, lumbar region: Secondary | ICD-10-CM | POA: Insufficient documentation

## 2020-07-02 DIAGNOSIS — R21 Rash and other nonspecific skin eruption: Secondary | ICD-10-CM | POA: Insufficient documentation

## 2020-07-02 DIAGNOSIS — Z7901 Long term (current) use of anticoagulants: Secondary | ICD-10-CM | POA: Insufficient documentation

## 2020-07-02 DIAGNOSIS — I509 Heart failure, unspecified: Secondary | ICD-10-CM | POA: Insufficient documentation

## 2020-07-02 DIAGNOSIS — E538 Deficiency of other specified B group vitamins: Secondary | ICD-10-CM | POA: Insufficient documentation

## 2020-07-02 DIAGNOSIS — I4891 Unspecified atrial fibrillation: Secondary | ICD-10-CM | POA: Insufficient documentation

## 2020-07-02 DIAGNOSIS — K5909 Other constipation: Secondary | ICD-10-CM | POA: Insufficient documentation

## 2020-07-02 DIAGNOSIS — J45902 Unspecified asthma with status asthmaticus: Secondary | ICD-10-CM | POA: Insufficient documentation

## 2020-07-02 DIAGNOSIS — J449 Chronic obstructive pulmonary disease, unspecified: Secondary | ICD-10-CM | POA: Insufficient documentation

## 2020-07-02 DIAGNOSIS — I872 Venous insufficiency (chronic) (peripheral): Secondary | ICD-10-CM | POA: Insufficient documentation

## 2020-07-02 DIAGNOSIS — E559 Vitamin D deficiency, unspecified: Secondary | ICD-10-CM | POA: Insufficient documentation

## 2020-07-02 DIAGNOSIS — K219 Gastro-esophageal reflux disease without esophagitis: Secondary | ICD-10-CM | POA: Insufficient documentation

## 2020-07-02 DIAGNOSIS — R609 Edema, unspecified: Secondary | ICD-10-CM | POA: Insufficient documentation

## 2020-07-02 DIAGNOSIS — K439 Ventral hernia without obstruction or gangrene: Secondary | ICD-10-CM | POA: Insufficient documentation

## 2020-07-03 ENCOUNTER — Encounter: Payer: Self-pay | Admitting: Cardiology

## 2020-07-03 ENCOUNTER — Other Ambulatory Visit: Payer: Self-pay

## 2020-07-03 ENCOUNTER — Ambulatory Visit: Payer: Medicare PPO | Admitting: Cardiology

## 2020-07-03 VITALS — BP 146/78 | HR 81 | Ht 61.0 in | Wt 171.2 lb

## 2020-07-03 DIAGNOSIS — Z86711 Personal history of pulmonary embolism: Secondary | ICD-10-CM

## 2020-07-03 DIAGNOSIS — I4821 Permanent atrial fibrillation: Secondary | ICD-10-CM

## 2020-07-03 DIAGNOSIS — I10 Essential (primary) hypertension: Secondary | ICD-10-CM | POA: Diagnosis not present

## 2020-07-03 NOTE — Progress Notes (Signed)
Cardiology Office Note:    Date:  07/03/2020   ID:  Kerri Rogers, DOB 05-14-1935, MRN [id number]  PCP:  Lucianne Lei, MD  Cardiologist:  Garwin Brothers, MD   Referring MD: Lucianne Lei, MD    ASSESSMENT:    1. Permanent atrial fibrillation (HCC)   2. Essential hypertension   3. History of pulmonary embolism    PLAN:    In order of problems listed above:  1. Permanent atrial fibrillation:I discussed with the patient atrial fibrillation, disease process. Management and therapy including rate and rhythm control, anticoagulation benefits and potential risks were discussed extensively with the patient. Patient had multiple questions which were answered to patient's satisfaction. 2. As mentioned above patient has had GI bleeding and awaiting clearance for assessment from gastroenterology for resuming anticoagulation.  As soon as they feel that she is appropriate that she can go back on anticoagulation.  She understands this.  If she has any questions she will be giving [place] a call. 3. Essential hypertension: Blood pressure stable diet was emphasized. 4. History of pulmonary embolism: Patient is also on anticoagulation for this purpose and it is held at this time and the understand benefits and potential risks. 5. Patient will be seen in follow-up appointment in 6 months or earlier if the patient has any concerns    Medication Adjustments/Labs and Tests Ordered: Current medicines are reviewed at length with the patient today.  Concerns regarding medicines are outlined above.  No orders of the defined types were placed in this encounter.  No orders of the defined types were placed in this encounter.    No chief complaint on file.    History of Present Illness:    Kerri Rogers is a 85 y.o. female.  Patient has past medical history of atrial fibrillation, essential hypertension and pulmonary embolism.  She recently had significant GI bleeding and is followed by Dr. Charm Barges local  gastroenterologist.  Patient has an appointment coming up for virtual endoscopy and this will help [place] assess whether the patient can get back to anticoagulation.  These issues will be addressed by Dr. Charm Barges after the endoscopy and we are awaiting for guidance from him on this issue.  Past Medical History:  Diagnosis Date  . Asthma with bronchitis and status asthmaticus   . Atrial fibrillation (HCC)   . Atrial flutter, paroxysmal (HCC) 03/01/2015  . B12 deficiency   . Bladder spasm   . CHF (congestive heart failure) (HCC)   . Chronic constipation   . COPD (chronic obstructive pulmonary disease) (HCC)   . Edema   . Essential hypertension 03/01/2015  . GERD (gastroesophageal reflux disease)   . Gout   . Herniated lumbar intervertebral disc   . History of pulmonary embolism 03/01/2015  . Hypertension   . Insomnia   . Long term (current) use of anticoagulants   . Overactive bladder   . Peripheral autonomic neuropathy   . Pre-ulcerative calluses   . Skin rash   . Stasis dermatitis   . Ventral hernia   . Vitamin D deficiency     Past Surgical History:  Procedure Laterality Date  . ABDOMINAL HYSTERECTOMY    . BLADDER SURGERY    . CATARACT EXTRACTION    . REPLACEMENT TOTAL KNEE BILATERAL    . TONSILLECTOMY      Current Medications: Current Meds  Medication Sig  . albuterol (VENTOLIN HFA) 108 (90 Base) MCG/ACT inhaler Inhale into the lungs every 4 (four) hours as needed.   Marland Kitchen  ALPRAZolam (XANAX) 0.25 MG tablet Take 0.25 mg by mouth as needed.   . B Complex Vitamins (VITAMIN B COMPLEX PO) Take 1 tablet by mouth 3 (three) times a week.   . digoxin (LANOXIN) 0.125 MG tablet TAKE 1 TABLET BY MOUTH ONCE (1) DAILY  . DOCOSAHEXAENOIC ACID PO Take 1,200 mg daily by mouth.   . ferrous sulfate 324 (65 Fe) MG TBEC Take 324 mg by mouth daily.   . furosemide (LASIX) 20 MG tablet Take 40 mg by mouth daily.   Marland Kitchen gabapentin (NEURONTIN) 600 MG tablet Take 600 mg by mouth 3 (three) times daily.  Marland Kitchen  latanoprost (XALATAN) 0.005 % ophthalmic solution 1 drop at bedtime.  Marland Kitchen MAGNESIUM PO Take 250 mg by mouth daily.   . montelukast (SINGULAIR) 10 MG tablet Take 10 mg by mouth daily.  . Multiple Vitamin (MULTIVITAMIN) tablet Take 1 tablet by mouth daily.  . Omega-3 Fatty Acids (KP FISH OIL) 1200 MG CAPS Take by mouth.  . oxybutynin (DITROPAN-XL) 10 MG 24 hr tablet Take 10 mg by mouth daily.  . pantoprazole (PROTONIX) 20 MG tablet Take 20 mg by mouth daily.  . Potassium 99 MG TABS Take 99 mg daily by mouth.   . traMADol (ULTRAM) 50 MG tablet Take 50 mg by mouth as needed.      Allergies:   Flagyl [metronidazole], Penicillins, and Sulfamethoxazole-trimethoprim   Social History   Socioeconomic History  . Marital status: Married    Spouse name: Not on file  . Number of children: Not on file  . Years of education: Not on file  . Highest education level: Not on file  Occupational History  . Not on file  Tobacco Use  . Smoking status: Never Smoker  . Smokeless tobacco: Never Used  Vaping Use  . Vaping Use: Never used  Substance and Sexual Activity  . Alcohol use: No  . Drug use: No  . Sexual activity: Not on file  Other Topics Concern  . Not on file  Social History Narrative  . Not on file   Social Determinants of Health   Financial Resource Strain:   . Difficulty of Paying Living Expenses: Not on file  Food Insecurity:   . Worried About Programme researcher, broadcasting/film/video in the Last Year: Not on file  . Ran Out of Food in the Last Year: Not on file  Transportation Needs:   . Lack of Transportation (Medical): Not on file  . Lack of Transportation (Non-Medical): Not on file  Physical Activity:   . Days of Exercise per Week: Not on file  . Minutes of Exercise per Session: Not on file  Stress:   . Feeling of Stress : Not on file  Social Connections:   . Frequency of Communication with Friends and Family: Not on file  . Frequency of Social Gatherings with Friends and Family: Not on file  .  Attends Religious Services: Not on file  . Active Member of Clubs or Organizations: Not on file  . Attends Banker Meetings: Not on file  . Marital Status: Not on file     Family History: The patient's family history includes Anxiety disorder in her sister; Asthma in her sister; Atrial fibrillation in her sister; Diabetes in her mother; GER disease in her sister; Heart failure in her mother; Hypertension in her brother. There is no history of Thyroid disease.  ROS:   Please see the history of present illness.    All other systems reviewed  and are negative.  EKGs/Labs/Other Studies Reviewed:    The following studies were reviewed today: EKG reveals atrial fibrillation with well-controlled ventricular rate   Recent Labs: No results found for requested labs within last 8760 hours.  Recent Lipid Panel    Component Value Date/Time   CHOL 127 04/21/2019 0917   TRIG 164 (H) 04/21/2019 0917   HDL 33 (L) 04/21/2019 0917   CHOLHDL 3.8 04/21/2019 0917   LDLCALC 66 04/21/2019 0917    Physical Exam:    VS:  BP (!) 146/78   Pulse 81   Ht 5\' 1"  (1.549 m)   Wt 171 lb 3.2 oz (77.7 kg)   SpO2 95%   BMI 32.35 kg/m     Wt Readings from Last 3 Encounters:  07/03/20 171 lb 3.2 oz (77.7 kg)  01/03/20 178 lb (80.7 kg)  06/21/19 185 lb (83.9 kg)     GEN: Patient is in no acute distress HEENT: Normal NECK: No JVD; No carotid bruits LYMPHATICS: No lymphadenopathy CARDIAC: Hear sounds regular, 2/6 systolic murmur at the apex. RESPIRATORY:  Clear to auscultation without rales, wheezing or rhonchi  ABDOMEN: Soft, non-tender, non-distended MUSCULOSKELETAL:  No edema; No deformity  SKIN: Warm and dry NEUROLOGIC:  Alert and oriented x 3 PSYCHIATRIC:  Normal affect   Signed, 13/03/20, MD  07/03/2020 4:32 PM    [facility]

## 2020-07-03 NOTE — Patient Instructions (Signed)

## 2020-07-05 ENCOUNTER — Ambulatory Visit
Admission: RE | Admit: 2020-07-05 | Discharge: 2020-07-05 | Disposition: A | Discharge status: Home / Self Care | Payer: Medicare PPO | Source: Ambulatory Visit | Attending: Unknown Physician Specialty | Admitting: Unknown Physician Specialty

## 2020-07-05 ENCOUNTER — Other Ambulatory Visit: Payer: Self-pay | Admitting: Cardiology

## 2020-07-05 DIAGNOSIS — R933 Abnormal findings on diagnostic imaging of other parts of digestive tract: Secondary | ICD-10-CM

## 2020-07-05 DIAGNOSIS — K5730 Diverticulosis of large intestine without perforation or abscess without bleeding: Secondary | ICD-10-CM | POA: Diagnosis not present

## 2020-07-10 DIAGNOSIS — R933 Abnormal findings on diagnostic imaging of other parts of digestive tract: Secondary | ICD-10-CM | POA: Diagnosis not present

## 2020-07-16 ENCOUNTER — Other Ambulatory Visit: Payer: Self-pay | Admitting: Cardiology

## 2020-07-16 DIAGNOSIS — Z1211 Encounter for screening for malignant neoplasm of colon: Secondary | ICD-10-CM | POA: Diagnosis not present

## 2020-07-16 DIAGNOSIS — R933 Abnormal findings on diagnostic imaging of other parts of digestive tract: Secondary | ICD-10-CM | POA: Diagnosis not present

## 2020-07-16 DIAGNOSIS — D500 Iron deficiency anemia secondary to blood loss (chronic): Secondary | ICD-10-CM | POA: Diagnosis not present

## 2020-07-16 DIAGNOSIS — Z9119 Patient's noncompliance with other medical treatment and regimen: Secondary | ICD-10-CM | POA: Diagnosis not present

## 2020-07-16 DIAGNOSIS — Z5309 Procedure and treatment not carried out because of other contraindication: Secondary | ICD-10-CM | POA: Diagnosis not present

## 2020-07-16 DIAGNOSIS — R195 Other fecal abnormalities: Secondary | ICD-10-CM | POA: Diagnosis not present

## 2020-07-19 DIAGNOSIS — J309 Allergic rhinitis, unspecified: Secondary | ICD-10-CM | POA: Diagnosis not present

## 2020-07-19 DIAGNOSIS — I10 Essential (primary) hypertension: Secondary | ICD-10-CM | POA: Diagnosis not present

## 2020-07-19 DIAGNOSIS — I872 Venous insufficiency (chronic) (peripheral): Secondary | ICD-10-CM | POA: Diagnosis not present

## 2020-07-19 DIAGNOSIS — Z6835 Body mass index (BMI) 35.0-35.9, adult: Secondary | ICD-10-CM | POA: Diagnosis not present

## 2020-07-19 DIAGNOSIS — E559 Vitamin D deficiency, unspecified: Secondary | ICD-10-CM | POA: Diagnosis not present

## 2020-07-19 DIAGNOSIS — M7989 Other specified soft tissue disorders: Secondary | ICD-10-CM | POA: Diagnosis not present

## 2020-07-19 DIAGNOSIS — Z8719 Personal history of other diseases of the digestive system: Secondary | ICD-10-CM | POA: Diagnosis not present

## 2020-07-19 DIAGNOSIS — I4891 Unspecified atrial fibrillation: Secondary | ICD-10-CM | POA: Diagnosis not present

## 2020-07-20 DIAGNOSIS — K6389 Other specified diseases of intestine: Secondary | ICD-10-CM | POA: Diagnosis not present

## 2020-07-31 ENCOUNTER — Other Ambulatory Visit: Payer: Self-pay | Admitting: Cardiology

## 2020-07-31 NOTE — Telephone Encounter (Signed)
Rx refill sent to pharmacy. 

## 2020-09-17 DIAGNOSIS — K6389 Other specified diseases of intestine: Secondary | ICD-10-CM | POA: Diagnosis not present

## 2020-09-18 ENCOUNTER — Telehealth: Payer: Self-pay

## 2020-09-18 NOTE — Telephone Encounter (Signed)
   [facility] Pre-operative Risk Assessment    Request for surgical clearance:  1. What type of surgery is being performed? Colonoscopy   2. When is this surgery scheduled? TBD   3. What type of clearance is required (medical clearance vs. Pharmacy clearance to hold med vs. Both)? Both  4. Are there any medications that need to be held prior to surgery and how long?Between 1-4 days    5. Practice name and name of physician performing surgery? [place]   6. What is your office phone number:[phone]    7.   What is your office fax number:[phone]  8.   Anesthesia type (None, local, MAC, general) ? None specified    Kerri Rogers 09/18/2020, 5:00 PM  _________________________________________________________________   (provider comments below)

## 2020-09-19 NOTE — Telephone Encounter (Signed)
Patient with diagnosis of A Fib on Xarelto for anticoagulation.    Of note, in Dr. Leisa Lenz last note, Xarelto was being held due to GI bleeding.  Waiting on GI to resume.  Unsure when this happened.  Procedure: Colonoscopy Date of procedure: TBD   CHA2DS2-VASc Score = 5  This indicates a 7.2% annual risk of stroke. The patient's score is based upon: CHF History: Yes HTN History: Yes Diabetes History: No Stroke History: No (Has history of PE) Vascular Disease History: No Age Score: 2 Gender Score: 1    CrCl 24mL/min Platelet count 219K  Per office protocol, patient can hold Xarelto for 2 days prior to procedure.

## 2020-09-19 NOTE — Telephone Encounter (Signed)
Pharmacy please comment on anticoagulation and then we will contact the patient for clearance.  Corine Shelter PA-C 09/19/2020 8:42 AM

## 2020-09-19 NOTE — Telephone Encounter (Signed)
   Primary Cardiologist: Dr Tomie China  Chart reviewed and patient contacted today by phone as part of pre-operative protocol coverage. Given past medical history and time since last visit, based on ACC/AHA guidelines, Kerri Rogers would be at acceptable risk for the planned procedure without further cardiovascular testing.   OK to hold Xarelto 2 days pre op, resume as soon as safe post op. The patient is aware of these instructions.  The patient was advised that if she develops new symptoms prior to surgery to contact our office to arrange for a follow-up visit, and she verbalized understanding.  I will route this recommendation to the requesting party via Epic fax function and remove from pre-op pool.  Please call with questions.  Corine Shelter, PA-C 09/19/2020, 1:14 PM

## 2020-09-27 DIAGNOSIS — K635 Polyp of colon: Secondary | ICD-10-CM | POA: Diagnosis not present

## 2020-09-27 DIAGNOSIS — Z86711 Personal history of pulmonary embolism: Secondary | ICD-10-CM | POA: Diagnosis not present

## 2020-09-27 DIAGNOSIS — R933 Abnormal findings on diagnostic imaging of other parts of digestive tract: Secondary | ICD-10-CM | POA: Diagnosis not present

## 2020-09-27 DIAGNOSIS — Q439 Congenital malformation of intestine, unspecified: Secondary | ICD-10-CM | POA: Diagnosis not present

## 2020-09-27 DIAGNOSIS — E785 Hyperlipidemia, unspecified: Secondary | ICD-10-CM | POA: Diagnosis not present

## 2020-09-27 DIAGNOSIS — I4891 Unspecified atrial fibrillation: Secondary | ICD-10-CM | POA: Diagnosis not present

## 2020-09-27 DIAGNOSIS — K5730 Diverticulosis of large intestine without perforation or abscess without bleeding: Secondary | ICD-10-CM | POA: Diagnosis not present

## 2020-09-27 DIAGNOSIS — D122 Benign neoplasm of ascending colon: Secondary | ICD-10-CM | POA: Diagnosis not present

## 2020-09-27 DIAGNOSIS — G5793 Unspecified mononeuropathy of bilateral lower limbs: Secondary | ICD-10-CM | POA: Diagnosis not present

## 2020-09-27 DIAGNOSIS — D123 Benign neoplasm of transverse colon: Secondary | ICD-10-CM | POA: Diagnosis not present

## 2020-09-27 DIAGNOSIS — D369 Benign neoplasm, unspecified site: Secondary | ICD-10-CM | POA: Diagnosis not present

## 2020-09-27 DIAGNOSIS — D120 Benign neoplasm of cecum: Secondary | ICD-10-CM | POA: Diagnosis not present

## 2020-09-27 DIAGNOSIS — I10 Essential (primary) hypertension: Secondary | ICD-10-CM | POA: Diagnosis not present

## 2020-10-02 ENCOUNTER — Other Ambulatory Visit: Payer: Self-pay

## 2020-10-02 DIAGNOSIS — M159 Polyosteoarthritis, unspecified: Secondary | ICD-10-CM | POA: Diagnosis not present

## 2020-10-02 DIAGNOSIS — G629 Polyneuropathy, unspecified: Secondary | ICD-10-CM | POA: Diagnosis not present

## 2020-10-02 DIAGNOSIS — E876 Hypokalemia: Secondary | ICD-10-CM | POA: Diagnosis not present

## 2020-10-02 DIAGNOSIS — Z8709 Personal history of other diseases of the respiratory system: Secondary | ICD-10-CM | POA: Diagnosis not present

## 2020-10-02 DIAGNOSIS — I872 Venous insufficiency (chronic) (peripheral): Secondary | ICD-10-CM | POA: Diagnosis not present

## 2020-10-02 DIAGNOSIS — G9009 Other idiopathic peripheral autonomic neuropathy: Secondary | ICD-10-CM | POA: Diagnosis not present

## 2020-10-02 DIAGNOSIS — I10 Essential (primary) hypertension: Secondary | ICD-10-CM | POA: Diagnosis not present

## 2020-10-02 DIAGNOSIS — Z79899 Other long term (current) drug therapy: Secondary | ICD-10-CM | POA: Diagnosis not present

## 2020-10-02 DIAGNOSIS — N3281 Overactive bladder: Secondary | ICD-10-CM | POA: Diagnosis not present

## 2020-10-03 ENCOUNTER — Other Ambulatory Visit: Payer: Self-pay

## 2020-10-03 ENCOUNTER — Ambulatory Visit: Payer: Medicare PPO | Admitting: Cardiology

## 2020-10-03 ENCOUNTER — Encounter: Payer: Self-pay | Admitting: Cardiology

## 2020-10-03 VITALS — BP 132/76 | HR 70 | Ht 61.0 in | Wt 174.0 lb

## 2020-10-03 DIAGNOSIS — I10 Essential (primary) hypertension: Secondary | ICD-10-CM

## 2020-10-03 DIAGNOSIS — I4891 Unspecified atrial fibrillation: Secondary | ICD-10-CM | POA: Diagnosis not present

## 2020-10-03 DIAGNOSIS — Z86711 Personal history of pulmonary embolism: Secondary | ICD-10-CM

## 2020-10-03 DIAGNOSIS — Z7901 Long term (current) use of anticoagulants: Secondary | ICD-10-CM | POA: Diagnosis not present

## 2020-10-03 NOTE — Progress Notes (Signed)
Cardiology Office Note:    Date:  10/03/2020   ID:  [name], DOB 15-Sep-1934, MRN [id number]  PCP:  Lucianne Lei, MD  Cardiologist:  Garwin Brothers, MD   Referring MD: Lucianne Lei, MD    ASSESSMENT:    1. Essential hypertension   2. Atrial fibrillation, unspecified type (HCC)   3. Long term (current) use of anticoagulants   4. History of pulmonary embolism    PLAN:    In order of problems listed above:  1. Permanent atrial fibrillation:I discussed with the patient atrial fibrillation, disease process. Management and therapy including rate and rhythm control, anticoagulation benefits and potential risks were discussed extensively with the patient. Patient had multiple questions which were answered to patient's satisfaction.  She will be monitored by her gastroenterologist for GI bleeding.  Currently she is not having any issues about bleeding. 2. Essential hypertension: Blood pressure stable and diet was emphasized. 3. History of pulmonary embolism: Stable on anticoagulation at this time. 4. Patient will be seen in follow-up appointment in 6 months or earlier if the patient has any concerns    Medication Adjustments/Labs and Tests Ordered: Current medicines are reviewed at length with the patient today.  Concerns regarding medicines are outlined above.  No orders of the defined types were placed in this encounter.  No orders of the defined types were placed in this encounter.    No chief complaint on file.    History of Present Illness:    Kerri Rogers is a 86 y.o. female.  Patient has past medical history of atrial fibrillation, essential hypertension and history of GI bleeding.  She is under the care of her gastroenterologist.  She denies any problems at this time and takes care of activities of daily living.  No chest pain orthopnea or PND.  At the time of my evaluation, the patient is alert awake oriented and in no distress.  Past Medical History:  Diagnosis Date   . Asthma with bronchitis and status asthmaticus   . Atrial fibrillation (HCC)   . Atrial flutter, paroxysmal (HCC) 03/01/2015  . B12 deficiency   . Bladder spasm   . CHF (congestive heart failure) (HCC)   . Chronic constipation   . COPD (chronic obstructive pulmonary disease) (HCC)   . Edema   . Essential hypertension 03/01/2015  . GERD (gastroesophageal reflux disease)   . Gout   . Herniated lumbar intervertebral disc   . History of pulmonary embolism 03/01/2015  . Hypertension   . Insomnia   . Long term (current) use of anticoagulants   . Overactive bladder   . Peripheral autonomic neuropathy   . Pre-ulcerative calluses   . Skin rash   . Stasis dermatitis   . Ventral hernia   . Vitamin D deficiency     Past Surgical History:  Procedure Laterality Date  . ABDOMINAL HYSTERECTOMY    . BLADDER SURGERY    . CATARACT EXTRACTION    . REPLACEMENT TOTAL KNEE BILATERAL    . TONSILLECTOMY      Current Medications: Current Meds  Medication Sig  . albuterol (VENTOLIN HFA) 108 (90 Base) MCG/ACT inhaler Inhale 1-2 puffs into the lungs every 4 (four) hours as needed for shortness of breath or wheezing.  Marland Kitchen ALPRAZolam (XANAX) 0.25 MG tablet Take 0.25 mg by mouth as needed for anxiety or sleep.  . B Complex Vitamins (VITAMIN B COMPLEX PO) Take 1 tablet by mouth 3 (three) times a week.   . digoxin (LANOXIN) 0.125  MG tablet TAKE 1 TABLET BY MOUTH ONCE (1) DAILY  . ferrous sulfate 324 (65 Fe) MG TBEC Take 324 mg by mouth daily.   . furosemide (LASIX) 20 MG tablet Take 40 mg by mouth daily.   Marland Kitchen gabapentin (NEURONTIN) 600 MG tablet Take 600 mg by mouth 3 (three) times daily.  Marland Kitchen latanoprost (XALATAN) 0.005 % ophthalmic solution Place 1 drop into both eyes at bedtime.  Marland Kitchen MAGNESIUM PO Take 250 mg by mouth daily.   . metoprolol tartrate (LOPRESSOR) 25 MG tablet TAKE 1/2 TABLET BY MOUTH TWICE DAILY  . montelukast (SINGULAIR) 10 MG tablet Take 10 mg by mouth daily.  . Multiple Vitamin  (MULTIVITAMIN) tablet Take 1 tablet by mouth daily.  Marland Kitchen oxybutynin (DITROPAN-XL) 10 MG 24 hr tablet Take 10 mg by mouth daily.  . pantoprazole (PROTONIX) 20 MG tablet Take 20 mg by mouth daily.  . potassium chloride (MICRO-K) 10 MEQ CR capsule Take 10 mEq by mouth daily.  Carlena Hurl 20 MG TABS tablet TAKE 1 TABLET BY MOUTH ONCE (1) DAILY WITH SUPPER     Allergies:   Flagyl [metronidazole], Penicillins, and Sulfamethoxazole-trimethoprim   Social History   Socioeconomic History  . Marital status: Married    Spouse name: Not on file  . Number of children: Not on file  . Years of education: Not on file  . Highest education level: Not on file  Occupational History  . Not on file  Tobacco Use  . Smoking status: Never Smoker  . Smokeless tobacco: Never Used  Vaping Use  . Vaping Use: Never used  Substance and Sexual Activity  . Alcohol use: No  . Drug use: No  . Sexual activity: Not on file  Other Topics Concern  . Not on file  Social History Narrative  . Not on file   Social Determinants of Health   Financial Resource Strain: Not on file  Food Insecurity: Not on file  Transportation Needs: Not on file  Physical Activity: Not on file  Stress: Not on file  Social Connections: Not on file     Family History: The patient's family history includes Anxiety disorder in her sister; Asthma in her sister; Atrial fibrillation in her sister; Diabetes in her mother; GER disease in her sister; Heart failure in her mother; Hypertension in her brother. There is no history of Thyroid disease.  ROS:   Please see the history of present illness.    All other systems reviewed and are negative.  EKGs/Labs/Other Studies Reviewed:    The following studies were reviewed today: I discussed my findings with the patient at length.   Recent Labs: No results found for requested labs within last 8760 hours.  Recent Lipid Panel    Component Value Date/Time   CHOL 127 04/21/2019 0917   TRIG 164  (H) 04/21/2019 0917   HDL 33 (L) 04/21/2019 0917   CHOLHDL 3.8 04/21/2019 0917   LDLCALC 66 04/21/2019 0917    Physical Exam:    VS:  BP 132/76   Pulse 70   Ht 5\' 1"  (1.549 m)   Wt 174 lb (78.9 kg)   SpO2 94%   BMI 32.88 kg/m     Wt Readings from Last 3 Encounters:  10/03/20 174 lb (78.9 kg)  07/03/20 171 lb 3.2 oz (77.7 kg)  01/03/20 178 lb (80.7 kg)     GEN: Patient is in no acute distress HEENT: Normal NECK: No JVD; No carotid bruits LYMPHATICS: No lymphadenopathy CARDIAC: Hear sounds  regular, 2/6 systolic murmur at the apex. RESPIRATORY:  Clear to auscultation without rales, wheezing or rhonchi  ABDOMEN: Soft, non-tender, non-distended MUSCULOSKELETAL:  No edema; No deformity  SKIN: Warm and dry NEUROLOGIC:  Alert and oriented x 3 PSYCHIATRIC:  Normal affect   Signed, Garwin Brothers, MD  10/03/2020 3:38 PM    [facility]

## 2020-10-03 NOTE — Patient Instructions (Signed)

## 2020-10-08 DIAGNOSIS — I4891 Unspecified atrial fibrillation: Secondary | ICD-10-CM | POA: Diagnosis not present

## 2020-10-08 DIAGNOSIS — K625 Hemorrhage of anus and rectum: Secondary | ICD-10-CM | POA: Diagnosis not present

## 2020-10-08 DIAGNOSIS — N170 Acute kidney failure with tubular necrosis: Secondary | ICD-10-CM | POA: Diagnosis not present

## 2020-10-08 DIAGNOSIS — K922 Gastrointestinal hemorrhage, unspecified: Secondary | ICD-10-CM | POA: Diagnosis not present

## 2020-10-09 DIAGNOSIS — I4891 Unspecified atrial fibrillation: Secondary | ICD-10-CM | POA: Diagnosis not present

## 2020-10-09 DIAGNOSIS — E860 Dehydration: Secondary | ICD-10-CM | POA: Diagnosis not present

## 2020-10-09 DIAGNOSIS — K922 Gastrointestinal hemorrhage, unspecified: Secondary | ICD-10-CM | POA: Diagnosis not present

## 2020-10-09 DIAGNOSIS — N170 Acute kidney failure with tubular necrosis: Secondary | ICD-10-CM | POA: Diagnosis not present

## 2020-10-10 DIAGNOSIS — K922 Gastrointestinal hemorrhage, unspecified: Secondary | ICD-10-CM | POA: Diagnosis not present

## 2020-10-10 DIAGNOSIS — N170 Acute kidney failure with tubular necrosis: Secondary | ICD-10-CM | POA: Diagnosis not present

## 2020-10-10 DIAGNOSIS — I4891 Unspecified atrial fibrillation: Secondary | ICD-10-CM | POA: Diagnosis not present

## 2020-10-11 DIAGNOSIS — K922 Gastrointestinal hemorrhage, unspecified: Secondary | ICD-10-CM | POA: Diagnosis not present

## 2020-10-11 DIAGNOSIS — N170 Acute kidney failure with tubular necrosis: Secondary | ICD-10-CM | POA: Diagnosis not present

## 2020-10-11 DIAGNOSIS — I4891 Unspecified atrial fibrillation: Secondary | ICD-10-CM | POA: Diagnosis not present

## 2020-10-16 DIAGNOSIS — D500 Iron deficiency anemia secondary to blood loss (chronic): Secondary | ICD-10-CM | POA: Diagnosis not present

## 2020-10-16 DIAGNOSIS — I4891 Unspecified atrial fibrillation: Secondary | ICD-10-CM | POA: Diagnosis not present

## 2020-10-16 DIAGNOSIS — Z79899 Other long term (current) drug therapy: Secondary | ICD-10-CM | POA: Diagnosis not present

## 2020-10-16 DIAGNOSIS — K922 Gastrointestinal hemorrhage, unspecified: Secondary | ICD-10-CM | POA: Diagnosis not present

## 2020-10-16 DIAGNOSIS — Z09 Encounter for follow-up examination after completed treatment for conditions other than malignant neoplasm: Secondary | ICD-10-CM | POA: Diagnosis not present

## 2020-10-23 DIAGNOSIS — H2513 Age-related nuclear cataract, bilateral: Secondary | ICD-10-CM | POA: Diagnosis not present

## 2020-10-25 DIAGNOSIS — Z09 Encounter for follow-up examination after completed treatment for conditions other than malignant neoplasm: Secondary | ICD-10-CM | POA: Diagnosis not present

## 2020-10-25 DIAGNOSIS — I4891 Unspecified atrial fibrillation: Secondary | ICD-10-CM | POA: Diagnosis not present

## 2020-10-25 DIAGNOSIS — D500 Iron deficiency anemia secondary to blood loss (chronic): Secondary | ICD-10-CM | POA: Diagnosis not present

## 2020-10-25 DIAGNOSIS — Z79899 Other long term (current) drug therapy: Secondary | ICD-10-CM | POA: Diagnosis not present

## 2020-10-25 DIAGNOSIS — K922 Gastrointestinal hemorrhage, unspecified: Secondary | ICD-10-CM | POA: Diagnosis not present

## 2020-11-06 ENCOUNTER — Other Ambulatory Visit: Payer: Self-pay | Admitting: Cardiology

## 2020-12-25 DIAGNOSIS — G9009 Other idiopathic peripheral autonomic neuropathy: Secondary | ICD-10-CM | POA: Diagnosis not present

## 2020-12-25 DIAGNOSIS — E876 Hypokalemia: Secondary | ICD-10-CM | POA: Diagnosis not present

## 2020-12-25 DIAGNOSIS — I872 Venous insufficiency (chronic) (peripheral): Secondary | ICD-10-CM | POA: Diagnosis not present

## 2020-12-25 DIAGNOSIS — N3281 Overactive bladder: Secondary | ICD-10-CM | POA: Diagnosis not present

## 2020-12-25 DIAGNOSIS — G629 Polyneuropathy, unspecified: Secondary | ICD-10-CM | POA: Diagnosis not present

## 2020-12-25 DIAGNOSIS — M159 Polyosteoarthritis, unspecified: Secondary | ICD-10-CM | POA: Diagnosis not present

## 2020-12-25 DIAGNOSIS — Z8709 Personal history of other diseases of the respiratory system: Secondary | ICD-10-CM | POA: Diagnosis not present

## 2020-12-25 DIAGNOSIS — J209 Acute bronchitis, unspecified: Secondary | ICD-10-CM | POA: Diagnosis not present

## 2020-12-25 DIAGNOSIS — I10 Essential (primary) hypertension: Secondary | ICD-10-CM | POA: Diagnosis not present

## 2020-12-31 ENCOUNTER — Other Ambulatory Visit: Payer: Self-pay | Admitting: Cardiology

## 2020-12-31 NOTE — Telephone Encounter (Signed)
Dr. Tomie China  Would you mind reviewing this with me.  Patient has history of PE, but from chart I can't determine if more than one, or provoked/unprovoked.  She also has atrial fibrillation.  Based on AF dosing of Xarelto, she only needs 15 mg daily instead of 20.  But if she is on lifelong anticoagulation because of multiple unprovoked  PE or some other need for full dose, then we will certainly leave her at 20 mg.  (the 15 mg dose will certainly cover her if she is in need of reduced intensity proplylactic dosing)  Thank you for your help.  Phillips Hay PharmD

## 2020-12-31 NOTE — Telephone Encounter (Signed)
Pt requesting refill of xarelto 20mg  when they only qualify for the 15mg  will route to pharmd pool to review. 46f, 78.9kg, Creatinine, Serum 1.270 mg/ 10/25/2020, lovw/ revankar 10/03/20, ccr 41

## 2021-01-02 ENCOUNTER — Other Ambulatory Visit: Payer: Self-pay | Admitting: Pharmacist Clinician (PhC)/ Clinical Pharmacy Specialist

## 2021-01-02 MED ORDER — RIVAROXABAN 15 MG PO TABS: 15.0000 mg | ORAL_TABLET | Freq: Every day | ORAL | 1 refills | Status: DC

## 2021-01-11 DIAGNOSIS — K5730 Diverticulosis of large intestine without perforation or abscess without bleeding: Secondary | ICD-10-CM | POA: Diagnosis not present

## 2021-01-11 DIAGNOSIS — D122 Benign neoplasm of ascending colon: Secondary | ICD-10-CM | POA: Diagnosis not present

## 2021-01-11 DIAGNOSIS — I110 Hypertensive heart disease with heart failure: Secondary | ICD-10-CM | POA: Diagnosis not present

## 2021-01-11 DIAGNOSIS — I509 Heart failure, unspecified: Secondary | ICD-10-CM | POA: Diagnosis not present

## 2021-01-11 DIAGNOSIS — E785 Hyperlipidemia, unspecified: Secondary | ICD-10-CM | POA: Diagnosis not present

## 2021-01-11 DIAGNOSIS — D123 Benign neoplasm of transverse colon: Secondary | ICD-10-CM | POA: Diagnosis not present

## 2021-01-11 DIAGNOSIS — E669 Obesity, unspecified: Secondary | ICD-10-CM | POA: Diagnosis not present

## 2021-01-11 DIAGNOSIS — K635 Polyp of colon: Secondary | ICD-10-CM | POA: Diagnosis not present

## 2021-01-11 DIAGNOSIS — Z79899 Other long term (current) drug therapy: Secondary | ICD-10-CM | POA: Diagnosis not present

## 2021-01-27 DIAGNOSIS — J9811 Atelectasis: Secondary | ICD-10-CM | POA: Diagnosis not present

## 2021-01-27 DIAGNOSIS — I10 Essential (primary) hypertension: Secondary | ICD-10-CM | POA: Diagnosis not present

## 2021-01-27 DIAGNOSIS — K922 Gastrointestinal hemorrhage, unspecified: Secondary | ICD-10-CM | POA: Diagnosis not present

## 2021-01-27 DIAGNOSIS — R079 Chest pain, unspecified: Secondary | ICD-10-CM | POA: Diagnosis not present

## 2021-01-27 DIAGNOSIS — K5730 Diverticulosis of large intestine without perforation or abscess without bleeding: Secondary | ICD-10-CM | POA: Diagnosis not present

## 2021-01-27 DIAGNOSIS — I472 Ventricular tachycardia: Secondary | ICD-10-CM | POA: Diagnosis not present

## 2021-01-27 DIAGNOSIS — I160 Hypertensive urgency: Secondary | ICD-10-CM | POA: Diagnosis not present

## 2021-01-27 DIAGNOSIS — N281 Cyst of kidney, acquired: Secondary | ICD-10-CM | POA: Diagnosis not present

## 2021-01-28 DIAGNOSIS — K625 Hemorrhage of anus and rectum: Secondary | ICD-10-CM | POA: Diagnosis not present

## 2021-01-28 DIAGNOSIS — N390 Urinary tract infection, site not specified: Secondary | ICD-10-CM | POA: Diagnosis not present

## 2021-01-28 DIAGNOSIS — K922 Gastrointestinal hemorrhage, unspecified: Secondary | ICD-10-CM | POA: Diagnosis not present

## 2021-01-28 DIAGNOSIS — I472 Ventricular tachycardia: Secondary | ICD-10-CM | POA: Diagnosis not present

## 2021-01-29 DIAGNOSIS — K625 Hemorrhage of anus and rectum: Secondary | ICD-10-CM | POA: Diagnosis not present

## 2021-01-29 DIAGNOSIS — I472 Ventricular tachycardia: Secondary | ICD-10-CM | POA: Diagnosis not present

## 2021-01-29 DIAGNOSIS — N390 Urinary tract infection, site not specified: Secondary | ICD-10-CM | POA: Diagnosis not present

## 2021-01-29 DIAGNOSIS — K922 Gastrointestinal hemorrhage, unspecified: Secondary | ICD-10-CM | POA: Diagnosis not present

## 2021-01-31 DIAGNOSIS — Z09 Encounter for follow-up examination after completed treatment for conditions other than malignant neoplasm: Secondary | ICD-10-CM | POA: Diagnosis not present

## 2021-01-31 DIAGNOSIS — K922 Gastrointestinal hemorrhage, unspecified: Secondary | ICD-10-CM | POA: Diagnosis not present

## 2021-01-31 DIAGNOSIS — N390 Urinary tract infection, site not specified: Secondary | ICD-10-CM | POA: Diagnosis not present

## 2021-01-31 DIAGNOSIS — I4891 Unspecified atrial fibrillation: Secondary | ICD-10-CM | POA: Diagnosis not present

## 2021-01-31 DIAGNOSIS — D500 Iron deficiency anemia secondary to blood loss (chronic): Secondary | ICD-10-CM | POA: Diagnosis not present

## 2021-03-26 DIAGNOSIS — E785 Hyperlipidemia, unspecified: Secondary | ICD-10-CM | POA: Diagnosis not present

## 2021-03-26 DIAGNOSIS — Z0000 Encounter for general adult medical examination without abnormal findings: Secondary | ICD-10-CM | POA: Diagnosis not present

## 2021-03-26 DIAGNOSIS — Z139 Encounter for screening, unspecified: Secondary | ICD-10-CM | POA: Diagnosis not present

## 2021-03-26 DIAGNOSIS — Z1331 Encounter for screening for depression: Secondary | ICD-10-CM | POA: Diagnosis not present

## 2021-03-26 DIAGNOSIS — E669 Obesity, unspecified: Secondary | ICD-10-CM | POA: Diagnosis not present

## 2021-03-26 DIAGNOSIS — Z9181 History of falling: Secondary | ICD-10-CM | POA: Diagnosis not present

## 2021-04-04 ENCOUNTER — Encounter: Payer: Self-pay | Admitting: Cardiology

## 2021-04-04 ENCOUNTER — Ambulatory Visit: Payer: Medicare PPO | Admitting: Cardiology

## 2021-04-04 ENCOUNTER — Other Ambulatory Visit: Payer: Self-pay

## 2021-04-04 VITALS — BP 140/90 | HR 76 | Ht 61.0 in | Wt 171.0 lb

## 2021-04-04 DIAGNOSIS — I4821 Permanent atrial fibrillation: Secondary | ICD-10-CM

## 2021-04-04 DIAGNOSIS — I10 Essential (primary) hypertension: Secondary | ICD-10-CM | POA: Diagnosis not present

## 2021-04-04 DIAGNOSIS — E559 Vitamin D deficiency, unspecified: Secondary | ICD-10-CM

## 2021-04-04 NOTE — Patient Instructions (Signed)
Medication Instructions:  No medication changes. *If you need a refill on your cardiac medications before your next appointment, please call your pharmacy*   Lab Work: Your physician recommends that you have labs done in the office today. Your test included  basic metabolic panel, complete blood count, TSH, liver function and vitamin D.  If you have labs (blood work) drawn today and your tests are completely normal, you will receive your results only by: MyChart Message (if you have MyChart) OR A paper copy in the mail If you have any lab test that is abnormal or we need to change your treatment, we will call you to review the results.   Testing/Procedures: None ordered   Follow-Up: At [facility], you and your health needs are our priority.  As part of our continuing mission to provide you with exceptional heart care, we have created designated Provider Care Teams.  These Care Teams include your primary Cardiologist (physician) and Advanced Practice Providers (APPs -  Physician Assistants and Nurse Practitioners) who all work together to provide you with the care you need, when you need it.  We recommend signing up for the patient portal called "MyChart".  Sign up information is provided on this After Visit Summary.  MyChart is used to connect with patients for Virtual Visits (Telemedicine).  Patients are able to view lab/test results, encounter notes, upcoming appointments, etc.  Non-urgent messages can be sent to your provider as well.   To learn more about what you can do with MyChart, go to [email or web address].    Your next appointment:   6 month(s)  The format for your next appointment:   In Person  Provider:   Belva Crome, MD   Other Instructions NA

## 2021-04-04 NOTE — Progress Notes (Signed)
Cardiology Office Note:    Date:  04/04/2021   ID:  Kerri Rogers, DOB August 17, 1935, MRN [id number]  PCP:  Lucianne Lei, MD  Cardiologist:  Garwin Brothers, MD   Referring MD: Lucianne Lei, MD    ASSESSMENT:    1. Permanent atrial fibrillation (HCC)   2. Essential hypertension    PLAN:    In order of problems listed above:  Primary prevention stressed with the patient.  Importance of compliance with diet medication stressed and she vocalized understanding. Atrial fibrillation:I discussed with the patient atrial fibrillation, disease process. Management and therapy including rate and rhythm control, anticoagulation benefits and potential risks were discussed extensively with the patient. Patient had multiple questions which were answered to patient's satisfaction.  I will do complete blood work on her today and then evaluate her Xarelto dose.  She is taking 15 mg daily at this point. Essential hypertension: Blood pressure stable and diet was emphasized.  Lifestyle modification urged.  Her blood pressures are better at home. Patient will be seen in follow-up appointment in 6 months or earlier if the patient has any concerns    Medication Adjustments/Labs and Tests Ordered: Current medicines are reviewed at length with the patient today.  Concerns regarding medicines are outlined above.  No orders of the defined types were placed in this encounter.  No orders of the defined types were placed in this encounter.    Chief Complaint  Patient presents with   Follow-up     History of Present Illness:    Kerri Rogers is a 86 y.o. female Patient has past medical history of atrial fibrillation, essential hypertension and GI bleeding in the past.  She is undergoing polypectomy and subsequently has not had any bleeding problems from her rectum.  At the time of my evaluation, the patient is alert awake oriented and in no distress.  Past Medical History:  Diagnosis Date   Asthma with  bronchitis and status asthmaticus    Atrial fibrillation (HCC)    Atrial flutter, paroxysmal (HCC) 03/01/2015   B12 deficiency    Bladder spasm    CHF (congestive heart failure) (HCC)    Chronic constipation    COPD (chronic obstructive pulmonary disease) (HCC)    Edema    Essential hypertension 03/01/2015   GERD (gastroesophageal reflux disease)    Gout    Herniated lumbar intervertebral disc    History of pulmonary embolism 03/01/2015   Hypertension    Insomnia    Long term (current) use of anticoagulants    Overactive bladder    Peripheral autonomic neuropathy    Pre-ulcerative calluses    Skin rash    Stasis dermatitis    Ventral hernia    Vitamin D deficiency     Past Surgical History:  Procedure Laterality Date   ABDOMINAL HYSTERECTOMY     BLADDER SURGERY     CATARACT EXTRACTION     REPLACEMENT TOTAL KNEE BILATERAL     TONSILLECTOMY      Current Medications: Current Meds  Medication Sig   albuterol (VENTOLIN HFA) 108 (90 Base) MCG/ACT inhaler Inhale 1-2 puffs into the lungs every 4 (four) hours as needed for shortness of breath or wheezing.   B Complex Vitamins (VITAMIN B COMPLEX PO) Take 1 tablet by mouth 3 (three) times a week.    digoxin (LANOXIN) 0.125 MG tablet TAKE 1 TABLET BY MOUTH ONCE (1) DAILY   ferrous sulfate 324 (65 Fe) MG TBEC Take 324 mg by mouth daily.  gabapentin (NEURONTIN) 600 MG tablet Take 600 mg by mouth 3 (three) times daily.   latanoprost (XALATAN) 0.005 % ophthalmic solution Place 1 drop into both eyes at bedtime.   MAGNESIUM PO Take 250 mg by mouth daily.    metoprolol tartrate (LOPRESSOR) 25 MG tablet TAKE 1/2 TABLET BY MOUTH TWICE DAILY   montelukast (SINGULAIR) 10 MG tablet Take 10 mg by mouth daily.   Multiple Vitamin (MULTIVITAMIN) tablet Take 1 tablet by mouth daily.   oxybutynin (DITROPAN-XL) 10 MG 24 hr tablet Take 10 mg by mouth daily.   pantoprazole (PROTONIX) 20 MG tablet Take 20 mg by mouth daily.   potassium chloride  (MICRO-K) 10 MEQ CR capsule Take 10 mEq by mouth daily.   Rivaroxaban (XARELTO) 15 MG TABS tablet Take 1 tablet (15 mg total) by mouth daily with supper.   torsemide (DEMADEX) 20 MG tablet Take 20 mg by mouth daily.     Allergies:   Flagyl [metronidazole], Penicillins, and Sulfamethoxazole-trimethoprim   Social History   Socioeconomic History   Marital status: Married    Spouse name: Not on file   Number of children: Not on file   Years of education: Not on file   Highest education level: Not on file  Occupational History   Not on file  Tobacco Use   Smoking status: Never   Smokeless tobacco: Never  Vaping Use   Vaping Use: Never used  Substance and Sexual Activity   Alcohol use: No   Drug use: No   Sexual activity: Not on file  Other Topics Concern   Not on file  Social History Narrative   Not on file   Social Determinants of Health   Financial Resource Strain: Not on file  Food Insecurity: Not on file  Transportation Needs: Not on file  Physical Activity: Not on file  Stress: Not on file  Social Connections: Not on file     Family History: The patient's family history includes Anxiety disorder in her sister; Asthma in her sister; Atrial fibrillation in her sister; Diabetes in her mother; GER disease in her sister; Heart failure in her mother; Hypertension in her brother. There is no history of Thyroid disease.  ROS:   Please see the history of present illness.    All other systems reviewed and are negative.  EKGs/Labs/Other Studies Reviewed:    The following studies were reviewed today: I discussed my findings with the patient at extensive length.   Recent Labs: No results found for requested labs within last 8760 hours.  Recent Lipid Panel    Component Value Date/Time   CHOL 127 04/21/2019 0917   TRIG 164 (H) 04/21/2019 0917   HDL 33 (L) 04/21/2019 0917   CHOLHDL 3.8 04/21/2019 0917   LDLCALC 66 04/21/2019 0917    Physical Exam:    VS:  BP 140/90  (BP Location: Right Arm, Patient Position: Sitting, Cuff Size: Normal)   Pulse 76   Ht 5\' 1"  (1.549 m)   Wt 171 lb (77.6 kg)   SpO2 93%   BMI 32.31 kg/m     Wt Readings from Last 3 Encounters:  04/04/21 171 lb (77.6 kg)  10/03/20 174 lb (78.9 kg)  07/03/20 171 lb 3.2 oz (77.7 kg)     GEN: Patient is in no acute distress HEENT: Normal NECK: No JVD; No carotid bruits LYMPHATICS: No lymphadenopathy CARDIAC: Hear sounds regular, 2/6 systolic murmur at the apex. RESPIRATORY:  Clear to auscultation without rales, wheezing or rhonchi  ABDOMEN: Soft, non-tender, non-distended MUSCULOSKELETAL:  No edema; No deformity  SKIN: Warm and dry NEUROLOGIC:  Alert and oriented x 3 PSYCHIATRIC:  Normal affect   Signed, Garwin Brothers, MD  04/04/2021 2:48 PM    [facility]

## 2021-04-04 NOTE — Addendum Note (Signed)
Addended by: Eleonore Chiquito on: 04/04/2021 03:00 PM   Modules accepted: Orders

## 2021-04-05 LAB — CBC WITH DIFFERENTIAL/PLATELET
Basophils Absolute: 0.1 10*3/uL (ref 0.0–0.2)
Basos: 1 %
EOS (ABSOLUTE): 0.1 10*3/uL (ref 0.0–0.4)
Eos: 1 %
Hematocrit: 44.3 % (ref 34.0–46.6)
Hemoglobin: 14.6 g/dL (ref 11.1–15.9)
Immature Grans (Abs): 0 10*3/uL (ref 0.0–0.1)
Immature Granulocytes: 0 %
Lymphocytes Absolute: 1.5 10*3/uL (ref 0.7–3.1)
Lymphs: 16 %
MCH: 30 pg (ref 26.6–33.0)
MCHC: 33 g/dL (ref 31.5–35.7)
MCV: 91 fL (ref 79–97)
Monocytes Absolute: 0.6 10*3/uL (ref 0.1–0.9)
Monocytes: 7 %
Neutrophils Absolute: 7.5 10*3/uL — ABNORMAL HIGH (ref 1.4–7.0)
Neutrophils: 75 %
Platelets: 234 10*3/uL (ref 150–450)
RBC: 4.86 x10E6/uL (ref 3.77–5.28)
RDW: 13.5 % (ref 11.7–15.4)
WBC: 9.8 10*3/uL (ref 3.4–10.8)

## 2021-04-05 LAB — BASIC METABOLIC PANEL
BUN/Creatinine Ratio: 19 (ref 12–28)
BUN: 29 mg/dL — ABNORMAL HIGH (ref 8–27)
CO2: 29 mmol/L (ref 20–29)
Calcium: 10.5 mg/dL — ABNORMAL HIGH (ref 8.7–10.3)
Chloride: 100 mmol/L (ref 96–106)
Creatinine, Ser: 1.5 mg/dL — ABNORMAL HIGH (ref 0.57–1.00)
Glucose: 103 mg/dL — ABNORMAL HIGH (ref 65–99)
Potassium: 3.9 mmol/L (ref 3.5–5.2)
Sodium: 143 mmol/L (ref 134–144)
eGFR: 34 mL/min/{1.73_m2} — ABNORMAL LOW (ref >59)

## 2021-04-05 LAB — HEPATIC FUNCTION PANEL
ALT: 16 IU/L (ref 0–32)
AST: 16 IU/L (ref 0–40)
Albumin: 3.7 g/dL (ref 3.6–4.6)
Alkaline Phosphatase: 74 IU/L (ref 44–121)
Bilirubin Total: 0.6 mg/dL (ref 0.0–1.2)
Bilirubin, Direct: 0.18 mg/dL (ref 0.00–0.40)
Total Protein: 6.2 g/dL (ref 6.0–8.5)

## 2021-04-05 LAB — VITAMIN D 25 HYDROXY (VIT D DEFICIENCY, FRACTURES): Vit D, 25-Hydroxy: 36.7 ng/mL (ref 30.0–100.0)

## 2021-04-05 LAB — TSH: TSH: 1.43 u[IU]/mL (ref 0.450–4.500)

## 2021-04-11 DIAGNOSIS — J309 Allergic rhinitis, unspecified: Secondary | ICD-10-CM | POA: Diagnosis not present

## 2021-04-11 DIAGNOSIS — M159 Polyosteoarthritis, unspecified: Secondary | ICD-10-CM | POA: Diagnosis not present

## 2021-04-11 DIAGNOSIS — N390 Urinary tract infection, site not specified: Secondary | ICD-10-CM | POA: Diagnosis not present

## 2021-04-11 DIAGNOSIS — I872 Venous insufficiency (chronic) (peripheral): Secondary | ICD-10-CM | POA: Diagnosis not present

## 2021-04-11 DIAGNOSIS — G629 Polyneuropathy, unspecified: Secondary | ICD-10-CM | POA: Diagnosis not present

## 2021-04-11 DIAGNOSIS — M7989 Other specified soft tissue disorders: Secondary | ICD-10-CM | POA: Diagnosis not present

## 2021-04-11 DIAGNOSIS — K219 Gastro-esophageal reflux disease without esophagitis: Secondary | ICD-10-CM | POA: Diagnosis not present

## 2021-04-11 DIAGNOSIS — I10 Essential (primary) hypertension: Secondary | ICD-10-CM | POA: Diagnosis not present

## 2021-04-11 DIAGNOSIS — G9009 Other idiopathic peripheral autonomic neuropathy: Secondary | ICD-10-CM | POA: Diagnosis not present

## 2021-05-08 DIAGNOSIS — J449 Chronic obstructive pulmonary disease, unspecified: Secondary | ICD-10-CM | POA: Diagnosis not present

## 2021-05-08 DIAGNOSIS — I10 Essential (primary) hypertension: Secondary | ICD-10-CM | POA: Diagnosis not present

## 2021-05-08 DIAGNOSIS — K219 Gastro-esophageal reflux disease without esophagitis: Secondary | ICD-10-CM | POA: Diagnosis not present

## 2021-05-15 DIAGNOSIS — Z961 Presence of intraocular lens: Secondary | ICD-10-CM | POA: Diagnosis not present

## 2021-05-15 DIAGNOSIS — H40013 Open angle with borderline findings, low risk, bilateral: Secondary | ICD-10-CM | POA: Diagnosis not present

## 2021-05-16 DIAGNOSIS — G629 Polyneuropathy, unspecified: Secondary | ICD-10-CM | POA: Diagnosis not present

## 2021-05-16 DIAGNOSIS — Z23 Encounter for immunization: Secondary | ICD-10-CM | POA: Diagnosis not present

## 2021-05-16 DIAGNOSIS — I10 Essential (primary) hypertension: Secondary | ICD-10-CM | POA: Diagnosis not present

## 2021-05-16 DIAGNOSIS — E559 Vitamin D deficiency, unspecified: Secondary | ICD-10-CM | POA: Diagnosis not present

## 2021-05-16 DIAGNOSIS — M7989 Other specified soft tissue disorders: Secondary | ICD-10-CM | POA: Diagnosis not present

## 2021-05-16 DIAGNOSIS — Z8679 Personal history of other diseases of the circulatory system: Secondary | ICD-10-CM | POA: Diagnosis not present

## 2021-05-16 DIAGNOSIS — I872 Venous insufficiency (chronic) (peripheral): Secondary | ICD-10-CM | POA: Diagnosis not present

## 2021-05-16 DIAGNOSIS — E876 Hypokalemia: Secondary | ICD-10-CM | POA: Diagnosis not present

## 2021-05-16 DIAGNOSIS — I4891 Unspecified atrial fibrillation: Secondary | ICD-10-CM | POA: Diagnosis not present

## 2021-05-16 DIAGNOSIS — Z79899 Other long term (current) drug therapy: Secondary | ICD-10-CM | POA: Diagnosis not present

## 2021-05-30 DIAGNOSIS — D649 Anemia, unspecified: Secondary | ICD-10-CM | POA: Diagnosis not present

## 2021-05-30 DIAGNOSIS — R109 Unspecified abdominal pain: Secondary | ICD-10-CM | POA: Diagnosis not present

## 2021-05-30 DIAGNOSIS — I4891 Unspecified atrial fibrillation: Secondary | ICD-10-CM | POA: Diagnosis not present

## 2021-05-30 DIAGNOSIS — N390 Urinary tract infection, site not specified: Secondary | ICD-10-CM | POA: Diagnosis not present

## 2021-05-30 DIAGNOSIS — N179 Acute kidney failure, unspecified: Secondary | ICD-10-CM | POA: Diagnosis not present

## 2021-05-30 DIAGNOSIS — M1990 Unspecified osteoarthritis, unspecified site: Secondary | ICD-10-CM | POA: Diagnosis not present

## 2021-05-30 DIAGNOSIS — N281 Cyst of kidney, acquired: Secondary | ICD-10-CM | POA: Diagnosis not present

## 2021-05-30 DIAGNOSIS — I517 Cardiomegaly: Secondary | ICD-10-CM | POA: Diagnosis not present

## 2021-05-30 DIAGNOSIS — I509 Heart failure, unspecified: Secondary | ICD-10-CM | POA: Diagnosis not present

## 2021-05-30 DIAGNOSIS — I160 Hypertensive urgency: Secondary | ICD-10-CM | POA: Diagnosis not present

## 2021-05-30 DIAGNOSIS — I10 Essential (primary) hypertension: Secondary | ICD-10-CM | POA: Diagnosis not present

## 2021-05-30 DIAGNOSIS — K760 Fatty (change of) liver, not elsewhere classified: Secondary | ICD-10-CM | POA: Diagnosis not present

## 2021-05-30 DIAGNOSIS — R079 Chest pain, unspecified: Secondary | ICD-10-CM | POA: Diagnosis not present

## 2021-05-30 DIAGNOSIS — I110 Hypertensive heart disease with heart failure: Secondary | ICD-10-CM | POA: Diagnosis not present

## 2021-05-30 DIAGNOSIS — R0789 Other chest pain: Secondary | ICD-10-CM | POA: Diagnosis not present

## 2021-05-30 DIAGNOSIS — R410 Disorientation, unspecified: Secondary | ICD-10-CM | POA: Diagnosis not present

## 2021-05-30 DIAGNOSIS — G9341 Metabolic encephalopathy: Secondary | ICD-10-CM | POA: Diagnosis not present

## 2021-05-30 DIAGNOSIS — I5032 Chronic diastolic (congestive) heart failure: Secondary | ICD-10-CM | POA: Diagnosis not present

## 2021-05-30 DIAGNOSIS — R4182 Altered mental status, unspecified: Secondary | ICD-10-CM | POA: Diagnosis not present

## 2021-05-31 DIAGNOSIS — I110 Hypertensive heart disease with heart failure: Secondary | ICD-10-CM | POA: Diagnosis not present

## 2021-05-31 DIAGNOSIS — M1990 Unspecified osteoarthritis, unspecified site: Secondary | ICD-10-CM | POA: Diagnosis not present

## 2021-05-31 DIAGNOSIS — I509 Heart failure, unspecified: Secondary | ICD-10-CM | POA: Diagnosis not present

## 2021-05-31 DIAGNOSIS — N390 Urinary tract infection, site not specified: Secondary | ICD-10-CM | POA: Diagnosis not present

## 2021-05-31 DIAGNOSIS — I5032 Chronic diastolic (congestive) heart failure: Secondary | ICD-10-CM | POA: Diagnosis not present

## 2021-05-31 DIAGNOSIS — R079 Chest pain, unspecified: Secondary | ICD-10-CM

## 2021-05-31 DIAGNOSIS — G9341 Metabolic encephalopathy: Secondary | ICD-10-CM | POA: Diagnosis not present

## 2021-05-31 DIAGNOSIS — R0789 Other chest pain: Secondary | ICD-10-CM | POA: Diagnosis not present

## 2021-05-31 DIAGNOSIS — N179 Acute kidney failure, unspecified: Secondary | ICD-10-CM | POA: Diagnosis not present

## 2021-05-31 DIAGNOSIS — I4891 Unspecified atrial fibrillation: Secondary | ICD-10-CM | POA: Diagnosis not present

## 2021-05-31 DIAGNOSIS — I10 Essential (primary) hypertension: Secondary | ICD-10-CM | POA: Diagnosis not present

## 2021-05-31 DIAGNOSIS — D649 Anemia, unspecified: Secondary | ICD-10-CM | POA: Diagnosis not present

## 2021-07-08 ENCOUNTER — Other Ambulatory Visit: Payer: Self-pay | Admitting: Cardiology

## 2021-07-16 DIAGNOSIS — E559 Vitamin D deficiency, unspecified: Secondary | ICD-10-CM | POA: Diagnosis not present

## 2021-07-16 DIAGNOSIS — Z79899 Other long term (current) drug therapy: Secondary | ICD-10-CM | POA: Diagnosis not present

## 2021-07-16 DIAGNOSIS — I4891 Unspecified atrial fibrillation: Secondary | ICD-10-CM | POA: Diagnosis not present

## 2021-07-16 DIAGNOSIS — C187 Malignant neoplasm of sigmoid colon: Secondary | ICD-10-CM | POA: Diagnosis not present

## 2021-07-16 DIAGNOSIS — M7989 Other specified soft tissue disorders: Secondary | ICD-10-CM | POA: Diagnosis not present

## 2021-07-16 DIAGNOSIS — E876 Hypokalemia: Secondary | ICD-10-CM | POA: Diagnosis not present

## 2021-07-16 DIAGNOSIS — I10 Essential (primary) hypertension: Secondary | ICD-10-CM | POA: Diagnosis not present

## 2021-07-16 DIAGNOSIS — G629 Polyneuropathy, unspecified: Secondary | ICD-10-CM | POA: Diagnosis not present

## 2021-07-16 DIAGNOSIS — I872 Venous insufficiency (chronic) (peripheral): Secondary | ICD-10-CM | POA: Diagnosis not present

## 2021-07-16 DIAGNOSIS — Z8679 Personal history of other diseases of the circulatory system: Secondary | ICD-10-CM | POA: Diagnosis not present

## 2021-07-17 DIAGNOSIS — J449 Chronic obstructive pulmonary disease, unspecified: Secondary | ICD-10-CM | POA: Diagnosis not present

## 2021-07-17 DIAGNOSIS — I10 Essential (primary) hypertension: Secondary | ICD-10-CM | POA: Diagnosis not present

## 2021-07-26 DIAGNOSIS — N390 Urinary tract infection, site not specified: Secondary | ICD-10-CM | POA: Diagnosis not present

## 2021-07-28 DIAGNOSIS — R110 Nausea: Secondary | ICD-10-CM | POA: Diagnosis not present

## 2021-07-28 DIAGNOSIS — Z20822 Contact with and (suspected) exposure to covid-19: Secondary | ICD-10-CM | POA: Diagnosis not present

## 2021-07-28 DIAGNOSIS — R109 Unspecified abdominal pain: Secondary | ICD-10-CM | POA: Diagnosis not present

## 2021-07-28 DIAGNOSIS — R1084 Generalized abdominal pain: Secondary | ICD-10-CM | POA: Diagnosis not present

## 2021-08-09 ENCOUNTER — Other Ambulatory Visit: Payer: Self-pay | Admitting: Cardiology

## 2021-08-18 DIAGNOSIS — I517 Cardiomegaly: Secondary | ICD-10-CM | POA: Diagnosis not present

## 2021-08-18 DIAGNOSIS — J121 Respiratory syncytial virus pneumonia: Secondary | ICD-10-CM | POA: Diagnosis not present

## 2021-08-18 DIAGNOSIS — R059 Cough, unspecified: Secondary | ICD-10-CM | POA: Diagnosis not present

## 2021-08-18 DIAGNOSIS — I361 Nonrheumatic tricuspid (valve) insufficiency: Secondary | ICD-10-CM | POA: Diagnosis not present

## 2021-08-18 DIAGNOSIS — I4891 Unspecified atrial fibrillation: Secondary | ICD-10-CM | POA: Diagnosis not present

## 2021-08-18 DIAGNOSIS — A419 Sepsis, unspecified organism: Secondary | ICD-10-CM | POA: Diagnosis not present

## 2021-08-18 DIAGNOSIS — I340 Nonrheumatic mitral (valve) insufficiency: Secondary | ICD-10-CM | POA: Diagnosis not present

## 2021-08-18 DIAGNOSIS — G928 Other toxic encephalopathy: Secondary | ICD-10-CM | POA: Diagnosis not present

## 2021-08-19 DIAGNOSIS — A419 Sepsis, unspecified organism: Secondary | ICD-10-CM | POA: Diagnosis not present

## 2021-08-19 DIAGNOSIS — G928 Other toxic encephalopathy: Secondary | ICD-10-CM | POA: Diagnosis not present

## 2021-08-19 DIAGNOSIS — J121 Respiratory syncytial virus pneumonia: Secondary | ICD-10-CM | POA: Diagnosis not present

## 2021-08-20 DIAGNOSIS — J121 Respiratory syncytial virus pneumonia: Secondary | ICD-10-CM | POA: Diagnosis not present

## 2021-08-20 DIAGNOSIS — R0902 Hypoxemia: Secondary | ICD-10-CM | POA: Diagnosis not present

## 2021-08-20 DIAGNOSIS — R0600 Dyspnea, unspecified: Secondary | ICD-10-CM | POA: Diagnosis not present

## 2021-08-20 DIAGNOSIS — G928 Other toxic encephalopathy: Secondary | ICD-10-CM | POA: Diagnosis not present

## 2021-08-20 DIAGNOSIS — A419 Sepsis, unspecified organism: Secondary | ICD-10-CM | POA: Diagnosis not present

## 2021-08-20 DIAGNOSIS — I517 Cardiomegaly: Secondary | ICD-10-CM | POA: Diagnosis not present

## 2021-08-20 DIAGNOSIS — J811 Chronic pulmonary edema: Secondary | ICD-10-CM | POA: Diagnosis not present

## 2021-08-20 DIAGNOSIS — M7989 Other specified soft tissue disorders: Secondary | ICD-10-CM | POA: Diagnosis not present

## 2021-08-21 DIAGNOSIS — J121 Respiratory syncytial virus pneumonia: Secondary | ICD-10-CM | POA: Diagnosis not present

## 2021-08-21 DIAGNOSIS — A419 Sepsis, unspecified organism: Secondary | ICD-10-CM | POA: Diagnosis not present

## 2021-08-21 DIAGNOSIS — G928 Other toxic encephalopathy: Secondary | ICD-10-CM | POA: Diagnosis not present

## 2021-08-22 DIAGNOSIS — A419 Sepsis, unspecified organism: Secondary | ICD-10-CM | POA: Diagnosis not present

## 2021-08-22 DIAGNOSIS — J121 Respiratory syncytial virus pneumonia: Secondary | ICD-10-CM | POA: Diagnosis not present

## 2021-08-22 DIAGNOSIS — I4891 Unspecified atrial fibrillation: Secondary | ICD-10-CM | POA: Diagnosis not present

## 2021-08-22 DIAGNOSIS — I361 Nonrheumatic tricuspid (valve) insufficiency: Secondary | ICD-10-CM | POA: Diagnosis not present

## 2021-08-22 DIAGNOSIS — G928 Other toxic encephalopathy: Secondary | ICD-10-CM | POA: Diagnosis not present

## 2021-08-22 DIAGNOSIS — I340 Nonrheumatic mitral (valve) insufficiency: Secondary | ICD-10-CM | POA: Diagnosis not present

## 2021-08-23 DIAGNOSIS — J121 Respiratory syncytial virus pneumonia: Secondary | ICD-10-CM | POA: Diagnosis not present

## 2021-08-23 DIAGNOSIS — G928 Other toxic encephalopathy: Secondary | ICD-10-CM | POA: Diagnosis not present

## 2021-08-23 DIAGNOSIS — A419 Sepsis, unspecified organism: Secondary | ICD-10-CM | POA: Diagnosis not present

## 2021-08-24 DIAGNOSIS — A419 Sepsis, unspecified organism: Secondary | ICD-10-CM | POA: Diagnosis not present

## 2021-08-24 DIAGNOSIS — J121 Respiratory syncytial virus pneumonia: Secondary | ICD-10-CM | POA: Diagnosis not present

## 2021-08-24 DIAGNOSIS — G928 Other toxic encephalopathy: Secondary | ICD-10-CM | POA: Diagnosis not present

## 2021-08-25 DIAGNOSIS — J121 Respiratory syncytial virus pneumonia: Secondary | ICD-10-CM | POA: Diagnosis not present

## 2021-08-25 DIAGNOSIS — A419 Sepsis, unspecified organism: Secondary | ICD-10-CM | POA: Diagnosis not present

## 2021-08-25 DIAGNOSIS — G928 Other toxic encephalopathy: Secondary | ICD-10-CM | POA: Diagnosis not present

## 2021-08-26 DIAGNOSIS — A419 Sepsis, unspecified organism: Secondary | ICD-10-CM | POA: Diagnosis not present

## 2021-08-26 DIAGNOSIS — J121 Respiratory syncytial virus pneumonia: Secondary | ICD-10-CM | POA: Diagnosis not present

## 2021-08-26 DIAGNOSIS — G928 Other toxic encephalopathy: Secondary | ICD-10-CM | POA: Diagnosis not present

## 2021-08-27 DIAGNOSIS — G928 Other toxic encephalopathy: Secondary | ICD-10-CM | POA: Diagnosis not present

## 2021-08-27 DIAGNOSIS — A419 Sepsis, unspecified organism: Secondary | ICD-10-CM | POA: Diagnosis not present

## 2021-08-27 DIAGNOSIS — J121 Respiratory syncytial virus pneumonia: Secondary | ICD-10-CM | POA: Diagnosis not present

## 2021-08-28 DIAGNOSIS — A419 Sepsis, unspecified organism: Secondary | ICD-10-CM | POA: Diagnosis not present

## 2021-08-28 DIAGNOSIS — I700 Atherosclerosis of aorta: Secondary | ICD-10-CM | POA: Diagnosis not present

## 2021-08-28 DIAGNOSIS — J121 Respiratory syncytial virus pneumonia: Secondary | ICD-10-CM | POA: Diagnosis not present

## 2021-08-28 DIAGNOSIS — G928 Other toxic encephalopathy: Secondary | ICD-10-CM | POA: Diagnosis not present

## 2021-08-28 DIAGNOSIS — J90 Pleural effusion, not elsewhere classified: Secondary | ICD-10-CM | POA: Diagnosis not present

## 2021-08-28 DIAGNOSIS — J811 Chronic pulmonary edema: Secondary | ICD-10-CM | POA: Diagnosis not present

## 2021-08-29 DIAGNOSIS — R918 Other nonspecific abnormal finding of lung field: Secondary | ICD-10-CM | POA: Diagnosis not present

## 2021-08-29 DIAGNOSIS — A419 Sepsis, unspecified organism: Secondary | ICD-10-CM | POA: Diagnosis not present

## 2021-08-29 DIAGNOSIS — Z9981 Dependence on supplemental oxygen: Secondary | ICD-10-CM | POA: Diagnosis not present

## 2021-08-29 DIAGNOSIS — J9621 Acute and chronic respiratory failure with hypoxia: Secondary | ICD-10-CM | POA: Diagnosis not present

## 2021-08-29 DIAGNOSIS — T17500A Unspecified foreign body in bronchus causing asphyxiation, initial encounter: Secondary | ICD-10-CM | POA: Diagnosis not present

## 2021-08-29 DIAGNOSIS — J121 Respiratory syncytial virus pneumonia: Secondary | ICD-10-CM | POA: Diagnosis not present

## 2021-08-29 DIAGNOSIS — G928 Other toxic encephalopathy: Secondary | ICD-10-CM | POA: Diagnosis not present

## 2021-08-30 DIAGNOSIS — I517 Cardiomegaly: Secondary | ICD-10-CM | POA: Diagnosis not present

## 2021-08-30 DIAGNOSIS — T17500A Unspecified foreign body in bronchus causing asphyxiation, initial encounter: Secondary | ICD-10-CM | POA: Diagnosis not present

## 2021-08-30 DIAGNOSIS — G928 Other toxic encephalopathy: Secondary | ICD-10-CM | POA: Diagnosis not present

## 2021-08-30 DIAGNOSIS — J9621 Acute and chronic respiratory failure with hypoxia: Secondary | ICD-10-CM | POA: Diagnosis not present

## 2021-08-30 DIAGNOSIS — A419 Sepsis, unspecified organism: Secondary | ICD-10-CM | POA: Diagnosis not present

## 2021-08-30 DIAGNOSIS — J121 Respiratory syncytial virus pneumonia: Secondary | ICD-10-CM | POA: Diagnosis not present

## 2021-08-30 DIAGNOSIS — Z9981 Dependence on supplemental oxygen: Secondary | ICD-10-CM | POA: Diagnosis not present

## 2021-08-30 DIAGNOSIS — J9690 Respiratory failure, unspecified, unspecified whether with hypoxia or hypercapnia: Secondary | ICD-10-CM | POA: Diagnosis not present

## 2021-08-31 DIAGNOSIS — G928 Other toxic encephalopathy: Secondary | ICD-10-CM | POA: Diagnosis not present

## 2021-08-31 DIAGNOSIS — J121 Respiratory syncytial virus pneumonia: Secondary | ICD-10-CM | POA: Diagnosis not present

## 2021-08-31 DIAGNOSIS — I517 Cardiomegaly: Secondary | ICD-10-CM | POA: Diagnosis not present

## 2021-08-31 DIAGNOSIS — A419 Sepsis, unspecified organism: Secondary | ICD-10-CM | POA: Diagnosis not present

## 2021-09-01 DIAGNOSIS — G928 Other toxic encephalopathy: Secondary | ICD-10-CM | POA: Diagnosis not present

## 2021-09-01 DIAGNOSIS — A419 Sepsis, unspecified organism: Secondary | ICD-10-CM | POA: Diagnosis not present

## 2021-09-01 DIAGNOSIS — I517 Cardiomegaly: Secondary | ICD-10-CM | POA: Diagnosis not present

## 2021-09-01 DIAGNOSIS — J121 Respiratory syncytial virus pneumonia: Secondary | ICD-10-CM | POA: Diagnosis not present

## 2021-09-01 DIAGNOSIS — I4891 Unspecified atrial fibrillation: Secondary | ICD-10-CM | POA: Diagnosis not present

## 2021-09-01 DIAGNOSIS — R0600 Dyspnea, unspecified: Secondary | ICD-10-CM | POA: Diagnosis not present

## 2021-09-02 DIAGNOSIS — G928 Other toxic encephalopathy: Secondary | ICD-10-CM | POA: Diagnosis not present

## 2021-09-02 DIAGNOSIS — J90 Pleural effusion, not elsewhere classified: Secondary | ICD-10-CM | POA: Diagnosis not present

## 2021-09-02 DIAGNOSIS — J121 Respiratory syncytial virus pneumonia: Secondary | ICD-10-CM | POA: Diagnosis not present

## 2021-09-02 DIAGNOSIS — R0902 Hypoxemia: Secondary | ICD-10-CM | POA: Diagnosis not present

## 2021-09-02 DIAGNOSIS — T17500A Unspecified foreign body in bronchus causing asphyxiation, initial encounter: Secondary | ICD-10-CM | POA: Diagnosis not present

## 2021-09-02 DIAGNOSIS — Z9981 Dependence on supplemental oxygen: Secondary | ICD-10-CM | POA: Diagnosis not present

## 2021-09-02 DIAGNOSIS — J811 Chronic pulmonary edema: Secondary | ICD-10-CM | POA: Diagnosis not present

## 2021-09-02 DIAGNOSIS — I4891 Unspecified atrial fibrillation: Secondary | ICD-10-CM | POA: Diagnosis not present

## 2021-09-02 DIAGNOSIS — R410 Disorientation, unspecified: Secondary | ICD-10-CM | POA: Diagnosis not present

## 2021-09-02 DIAGNOSIS — J9621 Acute and chronic respiratory failure with hypoxia: Secondary | ICD-10-CM | POA: Diagnosis not present

## 2021-09-02 DIAGNOSIS — A419 Sepsis, unspecified organism: Secondary | ICD-10-CM | POA: Diagnosis not present

## 2021-09-02 DIAGNOSIS — I517 Cardiomegaly: Secondary | ICD-10-CM | POA: Diagnosis not present

## 2021-09-03 DIAGNOSIS — J121 Respiratory syncytial virus pneumonia: Secondary | ICD-10-CM | POA: Diagnosis not present

## 2021-09-03 DIAGNOSIS — A419 Sepsis, unspecified organism: Secondary | ICD-10-CM | POA: Diagnosis not present

## 2021-09-03 DIAGNOSIS — T17500A Unspecified foreign body in bronchus causing asphyxiation, initial encounter: Secondary | ICD-10-CM | POA: Diagnosis not present

## 2021-09-03 DIAGNOSIS — Z9981 Dependence on supplemental oxygen: Secondary | ICD-10-CM | POA: Diagnosis not present

## 2021-09-03 DIAGNOSIS — G928 Other toxic encephalopathy: Secondary | ICD-10-CM | POA: Diagnosis not present

## 2021-09-03 DIAGNOSIS — J9621 Acute and chronic respiratory failure with hypoxia: Secondary | ICD-10-CM | POA: Diagnosis not present

## 2021-09-04 DIAGNOSIS — A419 Sepsis, unspecified organism: Secondary | ICD-10-CM | POA: Diagnosis not present

## 2021-09-04 DIAGNOSIS — G928 Other toxic encephalopathy: Secondary | ICD-10-CM | POA: Diagnosis not present

## 2021-09-04 DIAGNOSIS — T17500A Unspecified foreign body in bronchus causing asphyxiation, initial encounter: Secondary | ICD-10-CM | POA: Diagnosis not present

## 2021-09-04 DIAGNOSIS — J9621 Acute and chronic respiratory failure with hypoxia: Secondary | ICD-10-CM | POA: Diagnosis not present

## 2021-09-04 DIAGNOSIS — J121 Respiratory syncytial virus pneumonia: Secondary | ICD-10-CM | POA: Diagnosis not present

## 2021-09-04 DIAGNOSIS — Z9981 Dependence on supplemental oxygen: Secondary | ICD-10-CM | POA: Diagnosis not present

## 2021-09-05 DIAGNOSIS — Z9981 Dependence on supplemental oxygen: Secondary | ICD-10-CM | POA: Diagnosis not present

## 2021-09-05 DIAGNOSIS — R5381 Other malaise: Secondary | ICD-10-CM | POA: Diagnosis not present

## 2021-09-05 DIAGNOSIS — J9690 Respiratory failure, unspecified, unspecified whether with hypoxia or hypercapnia: Secondary | ICD-10-CM | POA: Diagnosis not present

## 2021-09-05 DIAGNOSIS — A419 Sepsis, unspecified organism: Secondary | ICD-10-CM | POA: Diagnosis not present

## 2021-09-05 DIAGNOSIS — G928 Other toxic encephalopathy: Secondary | ICD-10-CM | POA: Diagnosis not present

## 2021-09-05 DIAGNOSIS — J121 Respiratory syncytial virus pneumonia: Secondary | ICD-10-CM | POA: Diagnosis not present

## 2021-09-05 DIAGNOSIS — J9621 Acute and chronic respiratory failure with hypoxia: Secondary | ICD-10-CM | POA: Diagnosis not present

## 2021-09-05 DIAGNOSIS — T17500A Unspecified foreign body in bronchus causing asphyxiation, initial encounter: Secondary | ICD-10-CM | POA: Diagnosis not present

## 2021-09-06 DIAGNOSIS — A419 Sepsis, unspecified organism: Secondary | ICD-10-CM | POA: Diagnosis not present

## 2021-09-06 DIAGNOSIS — J121 Respiratory syncytial virus pneumonia: Secondary | ICD-10-CM | POA: Diagnosis not present

## 2021-09-06 DIAGNOSIS — G928 Other toxic encephalopathy: Secondary | ICD-10-CM | POA: Diagnosis not present

## 2021-09-07 DIAGNOSIS — J9621 Acute and chronic respiratory failure with hypoxia: Secondary | ICD-10-CM | POA: Diagnosis not present

## 2021-09-07 DIAGNOSIS — G928 Other toxic encephalopathy: Secondary | ICD-10-CM | POA: Diagnosis not present

## 2021-09-07 DIAGNOSIS — R5381 Other malaise: Secondary | ICD-10-CM | POA: Diagnosis not present

## 2021-09-07 DIAGNOSIS — J121 Respiratory syncytial virus pneumonia: Secondary | ICD-10-CM | POA: Diagnosis not present

## 2021-09-07 DIAGNOSIS — T17500A Unspecified foreign body in bronchus causing asphyxiation, initial encounter: Secondary | ICD-10-CM | POA: Diagnosis not present

## 2021-09-07 DIAGNOSIS — Z9981 Dependence on supplemental oxygen: Secondary | ICD-10-CM | POA: Diagnosis not present

## 2021-09-07 DIAGNOSIS — A419 Sepsis, unspecified organism: Secondary | ICD-10-CM | POA: Diagnosis not present

## 2021-09-08 DIAGNOSIS — G928 Other toxic encephalopathy: Secondary | ICD-10-CM | POA: Diagnosis not present

## 2021-09-08 DIAGNOSIS — J121 Respiratory syncytial virus pneumonia: Secondary | ICD-10-CM | POA: Diagnosis not present

## 2021-09-08 DIAGNOSIS — A419 Sepsis, unspecified organism: Secondary | ICD-10-CM | POA: Diagnosis not present

## 2021-09-09 DIAGNOSIS — T17500A Unspecified foreign body in bronchus causing asphyxiation, initial encounter: Secondary | ICD-10-CM | POA: Diagnosis not present

## 2021-09-09 DIAGNOSIS — J9621 Acute and chronic respiratory failure with hypoxia: Secondary | ICD-10-CM | POA: Diagnosis not present

## 2021-09-09 DIAGNOSIS — Z9981 Dependence on supplemental oxygen: Secondary | ICD-10-CM | POA: Diagnosis not present

## 2021-09-09 DIAGNOSIS — R5381 Other malaise: Secondary | ICD-10-CM | POA: Diagnosis not present

## 2021-09-09 DIAGNOSIS — A419 Sepsis, unspecified organism: Secondary | ICD-10-CM | POA: Diagnosis not present

## 2021-09-09 DIAGNOSIS — J121 Respiratory syncytial virus pneumonia: Secondary | ICD-10-CM | POA: Diagnosis not present

## 2021-09-09 DIAGNOSIS — G928 Other toxic encephalopathy: Secondary | ICD-10-CM | POA: Diagnosis not present

## 2021-09-10 DIAGNOSIS — A419 Sepsis, unspecified organism: Secondary | ICD-10-CM | POA: Diagnosis not present

## 2021-09-10 DIAGNOSIS — G928 Other toxic encephalopathy: Secondary | ICD-10-CM | POA: Diagnosis not present

## 2021-09-10 DIAGNOSIS — J121 Respiratory syncytial virus pneumonia: Secondary | ICD-10-CM | POA: Diagnosis not present

## 2021-09-11 DIAGNOSIS — G928 Other toxic encephalopathy: Secondary | ICD-10-CM | POA: Diagnosis not present

## 2021-09-11 DIAGNOSIS — A419 Sepsis, unspecified organism: Secondary | ICD-10-CM | POA: Diagnosis not present

## 2021-09-11 DIAGNOSIS — J121 Respiratory syncytial virus pneumonia: Secondary | ICD-10-CM | POA: Diagnosis not present

## 2021-09-17 DIAGNOSIS — I10 Essential (primary) hypertension: Secondary | ICD-10-CM | POA: Diagnosis not present

## 2021-09-17 DIAGNOSIS — E785 Hyperlipidemia, unspecified: Secondary | ICD-10-CM | POA: Diagnosis not present

## 2021-10-09 ENCOUNTER — Other Ambulatory Visit: Payer: Self-pay | Admitting: Cardiology

## 2021-10-09 ENCOUNTER — Ambulatory Visit: Payer: Medicare PPO | Admitting: Cardiology

## 2021-10-09 NOTE — Telephone Encounter (Signed)
Prescription refill request for Xarelto received.  Indication:Aflutter Last office visit:8/22 Weight:77.6 kg Age:87 Scr:1.5 CrCl:34.2 ml/min  Prescription refilled

## 2021-11-27 ENCOUNTER — Encounter: Payer: Self-pay | Admitting: Cardiology

## 2021-11-27 ENCOUNTER — Ambulatory Visit (INDEPENDENT_AMBULATORY_CARE_PROVIDER_SITE_OTHER): Payer: HMO | Admitting: Cardiology

## 2021-11-27 VITALS — BP 114/70 | HR 62 | Ht 61.0 in | Wt 160.0 lb

## 2021-11-27 DIAGNOSIS — I10 Essential (primary) hypertension: Secondary | ICD-10-CM | POA: Diagnosis not present

## 2021-11-27 DIAGNOSIS — Z86711 Personal history of pulmonary embolism: Secondary | ICD-10-CM | POA: Diagnosis not present

## 2021-11-27 DIAGNOSIS — I4892 Unspecified atrial flutter: Secondary | ICD-10-CM

## 2021-11-27 NOTE — Progress Notes (Signed)
?Cardiology Office Note:   ? ?Date:  11/27/2021  ? ?ID:  Kerri Rogers, DOB [birth year], MRN [id number] ? ?PCP:  Judge Stall, MD  ?Cardiologist:  Garwin Brothers, MD  ? ?Referring MD: Lucianne Lei, MD  ? ? ?ASSESSMENT:   ? ?1. Atrial flutter, paroxysmal (HCC)   ?2. Essential hypertension   ?3. History of pulmonary embolism   ? ?PLAN:   ? ?In order of problems listed above: ? ?Primary prevention stressed with the patient.  Importance of compliance with diet and medication stressed and she vocalized understanding. ?History of paroxysmal atrial flutter:I discussed with the patient atrial flutter, disease process. Management and therapy including rate and rhythm control, anticoagulation benefits and potential risks were discussed extensively with the patient. Patient had multiple questions which were answered to patient's satisfaction. ?History of pulmonary embolism: On anticoagulation.  Benefits and potential is explained and she vocalized understanding. ?Recent pneumonitis and hospitalization in December and January and those records were reviewed and discussed with the patient at length. ?Patient on diuretic therapy for dyspnea congestive heart failure-like symptoms and I will do blood work today including Chem-7 and CBC. ?Patient will be seen in follow-up appointment in 6 months or earlier if the patient has any concerns ? ? ? ?Medication Adjustments/Labs and Tests Ordered: ?Current medicines are reviewed at length with the patient today.  Concerns regarding medicines are outlined above.  ?No orders of the defined types were placed in this encounter. ? ?No orders of the defined types were placed in this encounter. ? ? ? ?Chief Complaint  ?Patient presents with  ? Follow-up  ?  ? ?History of Present Illness:   ? ?Kerri Rogers is a 87 y.o. female.  Patient has past medical history of paroxysmal atrial flutter, history of pulmonary embolism, essential hypertension.  She was admitted to the hospital with pneumonia and  spent a prolonged amount of time in the hospital and rehab facility.  Currently she looks well.  She denies any chest pain orthopnea or PND.  At the time of my evaluation, the patient is alert awake oriented and in no distress.  They are here for routine follow-up. ? ?Past Medical History:  ?Diagnosis Date  ? Asthma with bronchitis and status asthmaticus   ? Atrial fibrillation (HCC)   ? Atrial flutter, paroxysmal (HCC) 03/01/2015  ? B12 deficiency   ? Bladder spasm   ? CHF (congestive heart failure) (HCC)   ? Chronic constipation   ? COPD (chronic obstructive pulmonary disease) (HCC)   ? Edema   ? Essential hypertension 03/01/2015  ? GERD (gastroesophageal reflux disease)   ? Gout   ? Herniated lumbar intervertebral disc   ? History of pulmonary embolism 03/01/2015  ? Hypertension   ? Insomnia   ? Long term (current) use of anticoagulants   ? Overactive bladder   ? Peripheral autonomic neuropathy   ? Pre-ulcerative calluses   ? Skin rash   ? Stasis dermatitis   ? Ventral hernia   ? Vitamin D deficiency   ? ? ?Past Surgical History:  ?Procedure Laterality Date  ? ABDOMINAL HYSTERECTOMY    ? BLADDER SURGERY    ? CATARACT EXTRACTION    ? REPLACEMENT TOTAL KNEE BILATERAL    ? TONSILLECTOMY    ? ? ?Current Medications: ?Current Meds  ?Medication Sig  ? acetaminophen (TYLENOL) 500 MG tablet Take 1,000 mg by mouth 2 (two) times daily as needed for mild pain or moderate pain.  ? albuterol (VENTOLIN HFA) 108 (90  Base) MCG/ACT inhaler Inhale 1-2 puffs into the lungs every 4 (four) hours as needed for shortness of breath or wheezing.  ? amLODipine (NORVASC) 10 MG tablet Take 10 mg by mouth daily.  ? B Complex Vitamins (VITAMIN B COMPLEX PO) Take 1 tablet by mouth 3 (three) times a week.   ? digoxin (LANOXIN) 0.125 MG tablet Take 0.0625 mg by mouth daily.  ? diphenhydramine-acetaminophen (TYLENOL PM) 25-500 MG TABS tablet Take 2 tablets by mouth at bedtime as needed (sleep).  ? ferrous sulfate 324 (65 Fe) MG TBEC Take 324 mg by  mouth daily.   ? latanoprost (XALATAN) 0.005 % ophthalmic solution Place 1 drop into both eyes at bedtime.  ? MAGNESIUM PO Take 250 mg by mouth daily.   ? melatonin 5 MG TABS Take 5 mg by mouth at bedtime.  ? metoprolol tartrate (LOPRESSOR) 25 MG tablet Take 25 mg by mouth 2 (two) times daily.  ? montelukast (SINGULAIR) 10 MG tablet Take 10 mg by mouth daily.  ? Multiple Vitamin (MULTIVITAMIN) tablet Take 1 tablet by mouth daily.  ? ondansetron (ZOFRAN) 4 MG tablet Take 4 mg by mouth every 8 (eight) hours as needed for nausea or vomiting.  ? oxybutynin (DITROPAN-XL) 10 MG 24 hr tablet Take 10 mg by mouth daily.  ? potassium chloride (MICRO-K) 10 MEQ CR capsule Take 10 mEq by mouth daily.  ? torsemide (DEMADEX) 20 MG tablet Take 20 mg by mouth every other day. Then 40 mg every other day  ? XARELTO 15 MG TABS tablet TAKE 1 TABLET BY MOUTH ONCE DAILY WITH SUPPER  ?  ? ?Allergies:   Flagyl [metronidazole], Penicillins, and Sulfamethoxazole-trimethoprim  ? ?Social History  ? ?Socioeconomic History  ? Marital status: Married  ?  Spouse name: Not on file  ? Number of children: Not on file  ? Years of education: Not on file  ? Highest education level: Not on file  ?Occupational History  ? Not on file  ?Tobacco Use  ? Smoking status: Never  ? Smokeless tobacco: Never  ?Vaping Use  ? Vaping Use: Never used  ?Substance and Sexual Activity  ? Alcohol use: No  ? Drug use: No  ? Sexual activity: Not on file  ?Other Topics Concern  ? Not on file  ?Social History Narrative  ? Not on file  ? ?Social Determinants of Health  ? ?Financial Resource Strain: Not on file  ?Food Insecurity: Not on file  ?Transportation Needs: Not on file  ?Physical Activity: Not on file  ?Stress: Not on file  ?Social Connections: Not on file  ?  ? ?Family History: ?The patient's family history includes Anxiety disorder in her sister; Asthma in her sister; Atrial fibrillation in her sister; Diabetes in her mother; GER disease in her sister; Heart failure in  her mother; Hypertension in her brother. There is no history of Thyroid disease. ? ?ROS:   ?Please see the history of present illness.    ?All other systems reviewed and are negative. ? ?EKGs/Labs/Other Studies Reviewed:   ? ?The following studies were reviewed today: ?I reviewed  hospital records extensively and discussed with the patient at length ? ? ?Recent Labs: ?04/04/2021: ALT 16; BUN 29; Creatinine, Ser 1.50; Hemoglobin 14.6; Platelets 234; Potassium 3.9; Sodium 143; TSH 1.430  ?Recent Lipid Panel ?   ?Component Value Date/Time  ? CHOL 127 04/21/2019 0917  ? TRIG 164 (H) 04/21/2019 0917  ? HDL 33 (L) 04/21/2019 0917  ? CHOLHDL 3.8 04/21/2019 0917  ?  LDLCALC 66 04/21/2019 0917  ? ? ?Physical Exam:   ? ?VS:  BP 114/70 (BP Location: Left Arm, Patient Position: Sitting, Cuff Size: Normal)   Pulse 62   Ht 5\' 1"  (1.549 m)   Wt 160 lb (72.6 kg)   SpO2 98%   BMI 30.23 kg/m?    ? ?Wt Readings from Last 3 Encounters:  ?11/27/21 160 lb (72.6 kg)  ?04/04/21 171 lb (77.6 kg)  ?10/03/20 174 lb (78.9 kg)  ?  ? ?GEN: Patient is in no acute distress ?HEENT: Normal ?NECK: No JVD; No carotid bruits ?LYMPHATICS: No lymphadenopathy ?CARDIAC: Hear sounds regular, 2/6 systolic murmur at the apex. ?RESPIRATORY:  Clear to auscultation without rales, wheezing or rhonchi  ?ABDOMEN: Soft, non-tender, non-distended ?MUSCULOSKELETAL:  No edema; No deformity  ?SKIN: Warm and dry ?NEUROLOGIC:  Alert and oriented x 3 ?PSYCHIATRIC:  Normal affect  ? ?Signed, ?Garwin Brothers, MD  ?11/27/2021 11:39 AM    ?[facility]  ?

## 2021-11-27 NOTE — Patient Instructions (Signed)
Medication Instructions:  Your physician recommends that you continue on your current medications as directed. Please refer to the Current Medication list given to you today.  *If you need a refill on your cardiac medications before your next appointment, please call your pharmacy*   Lab Work: Your physician recommends that you have labs done in the office today. Your test included  basic metabolic panel and complete blood count.  If you have labs (blood work) drawn today and your tests are completely normal, you will receive your results only by: MyChart Message (if you have MyChart) OR A paper copy in the mail If you have any lab test that is abnormal or we need to change your treatment, we will call you to review the results.   Testing/Procedures: None ordered   Follow-Up: At [facility], you and your health needs are our priority.  As part of our continuing mission to provide you with exceptional heart care, we have created designated Provider Care Teams.  These Care Teams include your primary Cardiologist (physician) and Advanced Practice Providers (APPs -  Physician Assistants and Nurse Practitioners) who all work together to provide you with the care you need, when you need it.  We recommend signing up for the patient portal called "MyChart".  Sign up information is provided on this After Visit Summary.  MyChart is used to connect with patients for Virtual Visits (Telemedicine).  Patients are able to view lab/test results, encounter notes, upcoming appointments, etc.  Non-urgent messages can be sent to your provider as well.   To learn more about what you can do with MyChart, go to [email or web address].    Your next appointment:   6 month(s)  The format for your next appointment:   In Person  Provider:   Rajan Revankar, MD   Other Instructions NA   

## 2021-11-28 LAB — CBC WITH DIFFERENTIAL/PLATELET
Basophils Absolute: 0 10*3/uL (ref 0.0–0.2)
Basos: 1 %
EOS (ABSOLUTE): 0.1 10*3/uL (ref 0.0–0.4)
Eos: 1 %
Hematocrit: 42.2 % (ref 34.0–46.6)
Hemoglobin: 13.6 g/dL (ref 11.1–15.9)
Immature Grans (Abs): 0 10*3/uL (ref 0.0–0.1)
Immature Granulocytes: 0 %
Lymphocytes Absolute: 1.6 10*3/uL (ref 0.7–3.1)
Lymphs: 22 %
MCH: 28.5 pg (ref 26.6–33.0)
MCHC: 32.2 g/dL (ref 31.5–35.7)
MCV: 88 fL (ref 79–97)
Monocytes Absolute: 0.5 10*3/uL (ref 0.1–0.9)
Monocytes: 8 %
Neutrophils Absolute: 4.8 10*3/uL (ref 1.4–7.0)
Neutrophils: 68 %
Platelets: 259 10*3/uL (ref 150–450)
RBC: 4.78 x10E6/uL (ref 3.77–5.28)
RDW: 15.5 % — ABNORMAL HIGH (ref 11.7–15.4)
WBC: 7.1 10*3/uL (ref 3.4–10.8)

## 2021-11-28 LAB — BASIC METABOLIC PANEL
BUN/Creatinine Ratio: 22 (ref 12–28)
BUN: 32 mg/dL — ABNORMAL HIGH (ref 8–27)
CO2: 25 mmol/L (ref 20–29)
Calcium: 11.5 mg/dL — ABNORMAL HIGH (ref 8.7–10.3)
Chloride: 100 mmol/L (ref 96–106)
Creatinine, Ser: 1.46 mg/dL — ABNORMAL HIGH (ref 0.57–1.00)
Glucose: 84 mg/dL (ref 70–99)
Potassium: 4.4 mmol/L (ref 3.5–5.2)
Sodium: 140 mmol/L (ref 134–144)
eGFR: 35 mL/min/{1.73_m2} — ABNORMAL LOW (ref >59)

## 2021-12-23 IMAGING — CT CT VIRTUAL COLONOSCOPY DIAGNOSTIC
2 of 9 series · 13 of 46 positions shown, 15 images · non-contrast
Comparison: 06/09/2020

CLINICAL DATA: Rectal bleeding, abnormal CT

EXAM:
CT VIRTUAL COLONOSCOPY DIAGNOSTIC
TECHNIQUE: The patient was given a standard bowel preparation with Gastrografin
and barium for fluid and stool tagging respectively. The quality of
the bowel preparation is poor. Automated CO2 insufflation of the
colon was performed prior to image acquisition and colonic
distention is poor. Image post processing was used to generate a 3D
endoluminal fly-through projection of the colon and to
electronically subtract stool/fluid as appropriate.

[Series 11: rt decub colon 1.50 br40 s3 rt decub thin · axial · 0.89mm/px · z∈[+1183,+1580]mm · 10 of 325 slices shown, 12 images]
[im 30/325  soft-tissue]
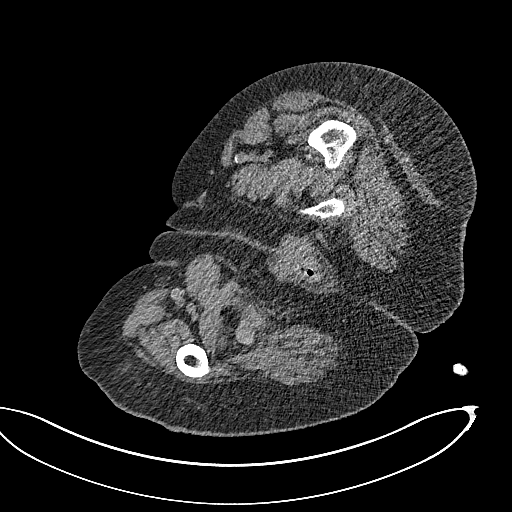
[im 30/325  bone]
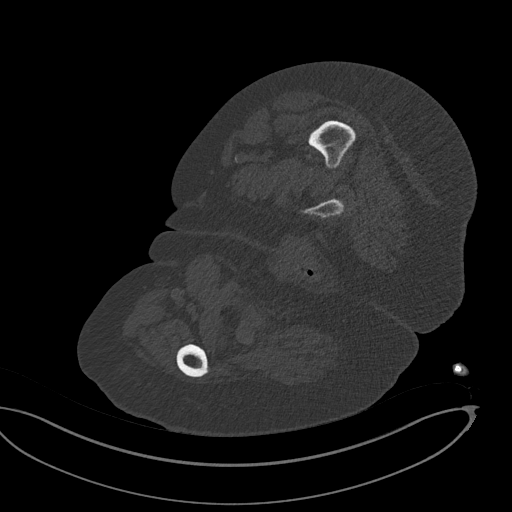
[im 59/325  soft-tissue]
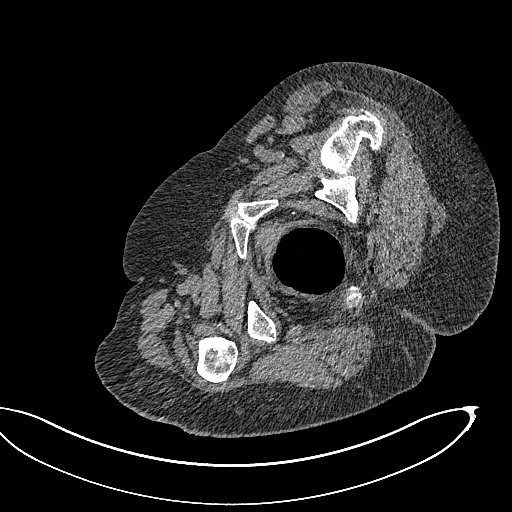
[im 89/325  soft-tissue]
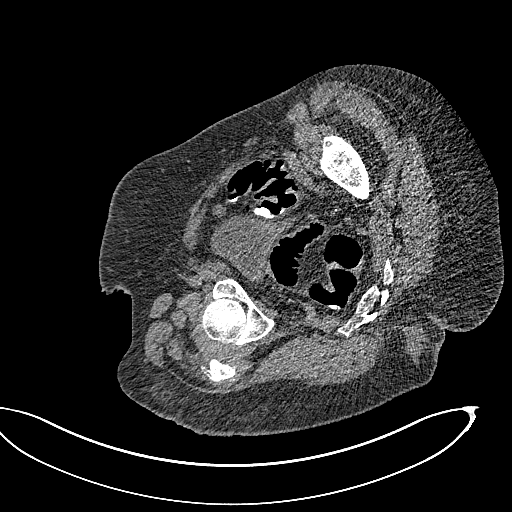
[im 118/325  soft-tissue]
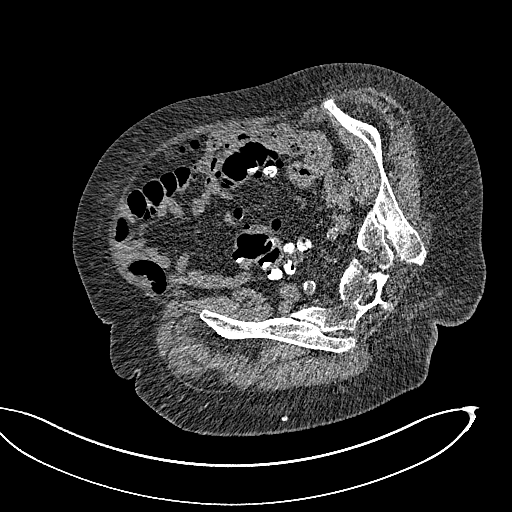
[im 148/325  soft-tissue]
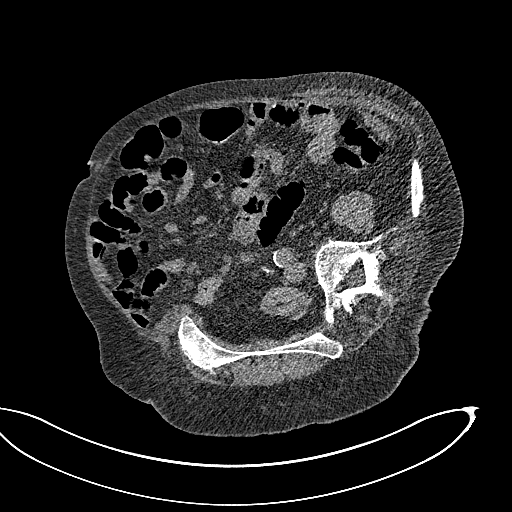
[im 177/325  soft-tissue]
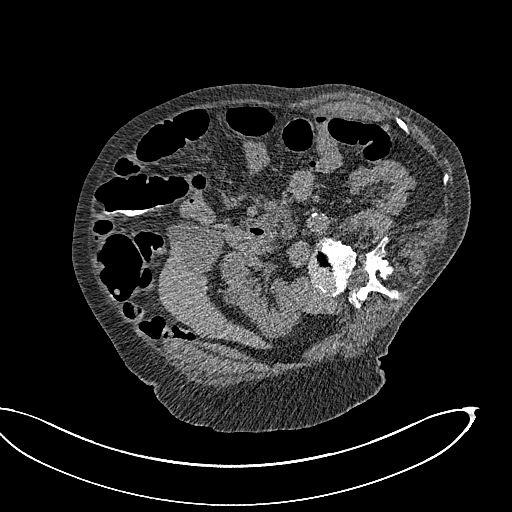
[im 207/325  soft-tissue]
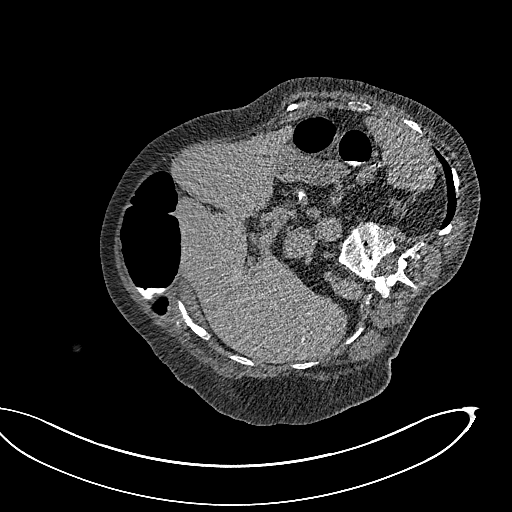
[im 236/325  soft-tissue]
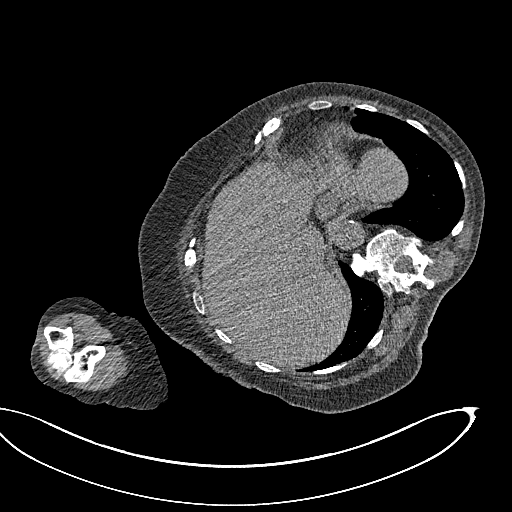
[im 266/325  soft-tissue]
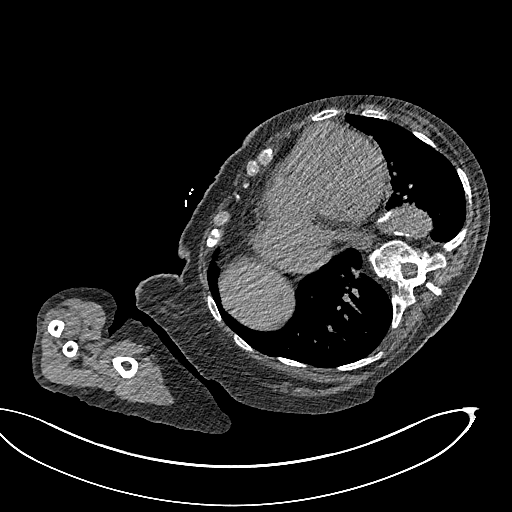
[im 266/325  bone]
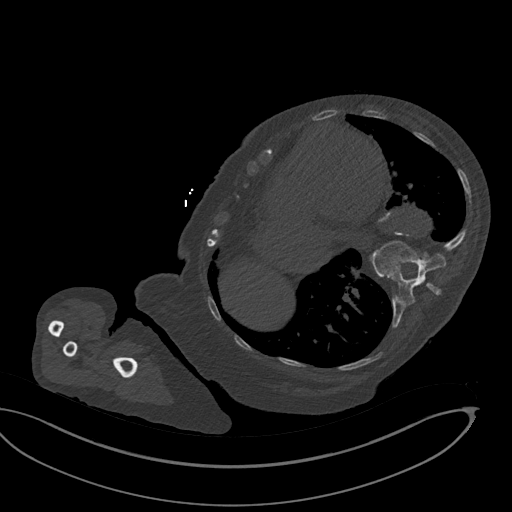
[im 295/325  soft-tissue]
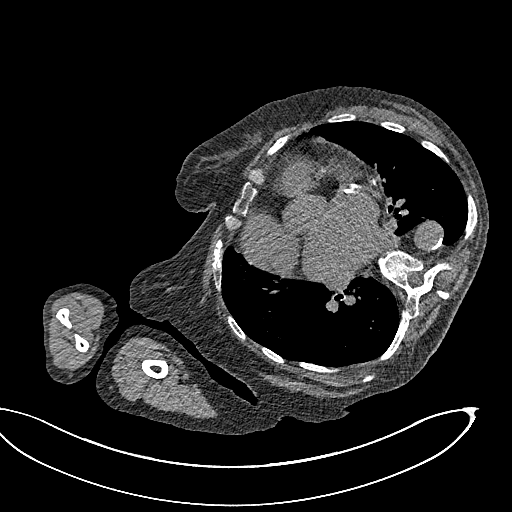

[Series 13: rt decub colon 3.00 br40 s3 cor rt decub · coronal · 0.69mm/px · 3 of 153 slices shown]
[im 31/153  soft-tissue]
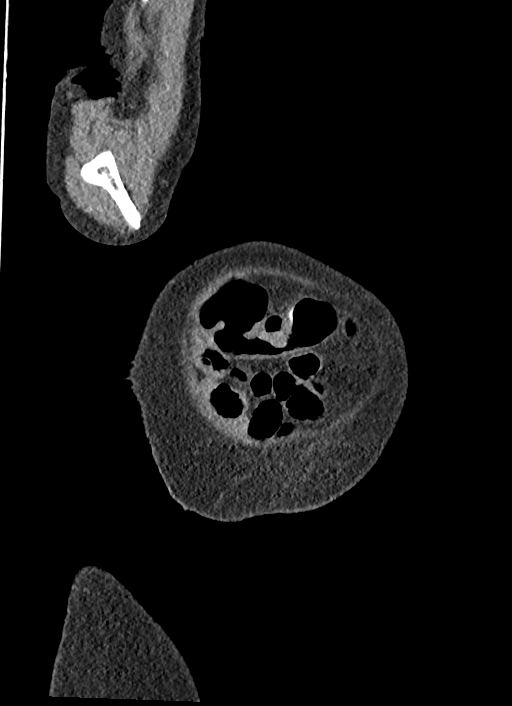
[im 61/153  soft-tissue]
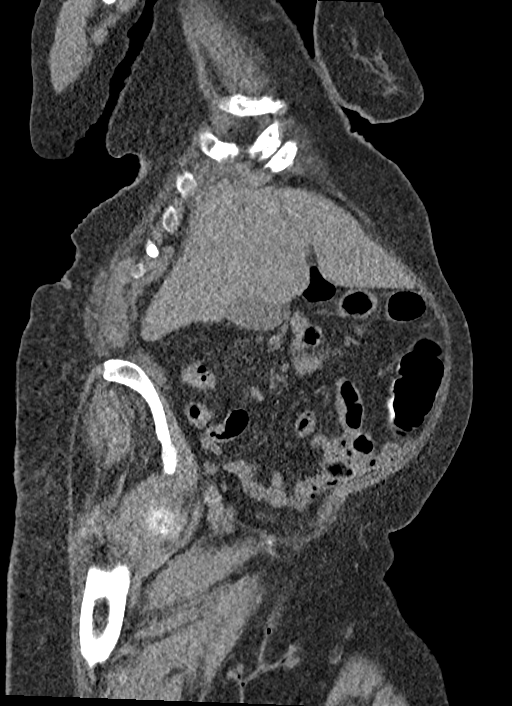
[im 92/153  soft-tissue]
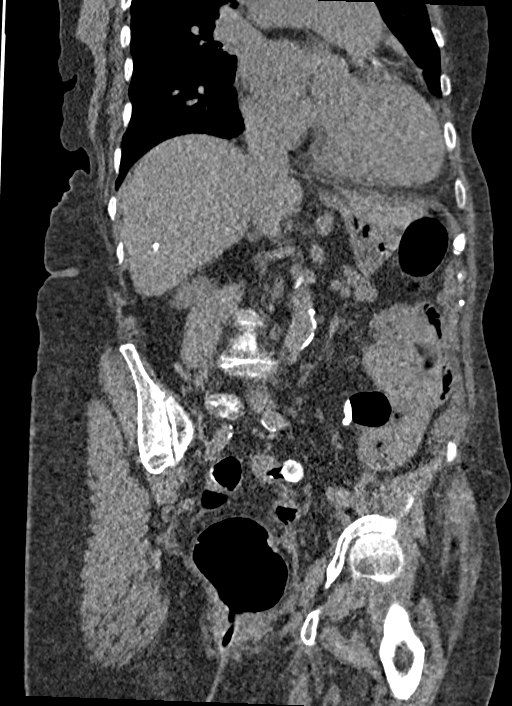

[13 of 46 positions shown; findings below may reference images not displayed]

FINDINGS: VIRTUAL COLONOSCOPY

Large amount of retained barium in the colon. Suboptimal distension
of the colon, particularly the sigmoid colon and descending colon on
the supine images. Much of the colon is under distended on the
decubitus images. Large mass is seen in the cecum as noted on prior
CT. This measures up to 3.9 cm. This is persistent on decubitus
images and is non dependent along the anterior wall on supine
imaging and is concerning for polypoid lesion, possible malignancy.
At least 2 other polypoid masslike areas are noted in the ascending
colon measuring 1.5 cm on supine image 119, series 4, and on image
150 of decubitus image, series 11 also, on decubitus series,
adjacent to this 15 mm nodule is a separate 2.5 cm nodular area.
These are also concerning for polyps and possible malignancy. No
other definite polypoid mass or annular constricting lesion.
Extensive sigmoid diverticulosis. Tortuous sigmoid colon and
transverse colon.

Virtual colonoscopy is not designed to detect diminutive polyps
(i.e., less than or equal to 5 mm), the presence or absence of which
may not affect clinical management.

CT ABDOMEN AND PELVIS WITHOUT CONTRAST

Lower chest: Cardiomegaly.  No acute abnormality.

Hepatobiliary: Calcification in the right hepatic lobe. No
suspicious focal hepatic abnormality seen. Gallbladder unremarkable.

Pancreas: No focal abnormality or ductal dilatation.

Spleen: No focal abnormality.  Normal size.

Adrenals/Urinary Tract: No hydronephrosis. Exophytic isodense and
low-density areas off both kidneys may reflect cysts although these
cannot be characterized on this noncontrast study. Adrenal glands
and urinary bladder unremarkable.

Stomach/Bowel: Stomach and small bowel decompressed, unremarkable.

Vascular/Lymphatic: Aortic atherosclerosis.

Reproductive: Prior hysterectomy.  No adnexal masses.

Other: No free fluid or free air.

Musculoskeletal: No acute bony abnormality.
IMPRESSION: Study is suboptimal and nondiagnostic throughout many portions of
the colon due to under distention and large amount of retained
barium. However, there are 3 concerning polypoid masses/filling
defect noted in the right colon including cecum, and ascending
colon, possibly near the hepatic flexure. These measure 1.5 cm,
cm, and 3.9 cm. These are concerning for possible polypoid masses
and malignancy.

Tortuous colon.

Extensive sigmoid diverticulosis.

Aortic atherosclerosis.

Small isodense and low-density lesions within both kidneys. I favor
these represent cysts, but these cannot be characterized on this
noncontrast study. Consider further evaluation with
contrast-enhanced CT or ultrasound.

## 2022-05-26 ENCOUNTER — Encounter: Payer: Self-pay | Admitting: Cardiology

## 2022-05-26 ENCOUNTER — Ambulatory Visit: Payer: PPO | Attending: Cardiology | Admitting: Cardiology

## 2022-05-26 VITALS — BP 136/88 | HR 68 | Ht 61.0 in | Wt 160.6 lb

## 2022-05-26 DIAGNOSIS — I4891 Unspecified atrial fibrillation: Secondary | ICD-10-CM

## 2022-05-26 DIAGNOSIS — Z86711 Personal history of pulmonary embolism: Secondary | ICD-10-CM | POA: Diagnosis not present

## 2022-05-26 DIAGNOSIS — I10 Essential (primary) hypertension: Secondary | ICD-10-CM | POA: Diagnosis not present

## 2022-05-26 NOTE — Progress Notes (Signed)
Cardiology Office Note:    Date:  05/26/2022   ID:  Kerri Rogers, DOB June 07, 1935, MRN [id number]  PCP:  Judge Stall, MD  Cardiologist:  Garwin Brothers, MD   Referring MD: Judge Stall, MD    ASSESSMENT:    1. Atrial fibrillation, unspecified type (HCC)   2. Essential hypertension   3. History of pulmonary embolism    PLAN:    In order of problems listed above:  Primary prevention stressed with the patient.  Importance of compliance with diet and medication stressed and she vocalized understanding. Essential hypertension: Blood pressure is stable and diet was emphasized.  Lifestyle modification urged. Atrial fibrillation:I discussed with the patient atrial fibrillation, disease process. Management and therapy including rate and rhythm control, anticoagulation benefits and potential risks were discussed extensively with the patient. Patient had multiple questions which were answered to patient's satisfaction. History of pulmonary embolism: On anticoagulation. Renal insufficiency: Followed by primary care.  Labs from [facility] sheet were reviewed. Patient will be seen in follow-up appointment in 6 months or earlier if the patient has any concerns    Medication Adjustments/Labs and Tests Ordered: Current medicines are reviewed at length with the patient today.  Concerns regarding medicines are outlined above.  No orders of the defined types were placed in this encounter.  No orders of the defined types were placed in this encounter.    No chief complaint on file.    History of Present Illness:    Kerri Rogers is a 87 y.o. female.  Patient has past medical history of essential hypertension, atrial fibrillation and history of pulmonary embolism.  She denies any problems at this time and takes care of activities of daily living.  No chest pain orthopnea or PND.  At the time of my evaluation, the patient is alert awake oriented and in no distress.  Past Medical History:   Diagnosis Date   Asthma with bronchitis and status asthmaticus    Atrial fibrillation (HCC)    Atrial flutter, paroxysmal (HCC) 03/01/2015   B12 deficiency    Bladder spasm    CHF (congestive heart failure) (HCC)    Chronic constipation    COPD (chronic obstructive pulmonary disease) (HCC)    Edema    Essential hypertension 03/01/2015   GERD (gastroesophageal reflux disease)    Gout    Herniated lumbar intervertebral disc    History of pulmonary embolism 03/01/2015   Hypertension    Insomnia    Long term (current) use of anticoagulants    Overactive bladder    Peripheral autonomic neuropathy    Pre-ulcerative calluses    Skin rash    Stasis dermatitis    Ventral hernia    Vitamin D deficiency     Past Surgical History:  Procedure Laterality Date   ABDOMINAL HYSTERECTOMY     BLADDER SURGERY     CATARACT EXTRACTION     REPLACEMENT TOTAL KNEE BILATERAL     TONSILLECTOMY      Current Medications: Current Meds  Medication Sig   acetaminophen (TYLENOL) 500 MG tablet Take 1,000 mg by mouth 2 (two) times daily as needed for mild pain or moderate pain.   albuterol (VENTOLIN HFA) 108 (90 Base) MCG/ACT inhaler Inhale 1-2 puffs into the lungs every 4 (four) hours as needed for shortness of breath or wheezing.   digoxin (LANOXIN) 0.125 MG tablet Take 0.0625 mg by mouth daily.   diphenhydramine-acetaminophen (TYLENOL PM) 25-500 MG TABS tablet Take 2 tablets by mouth at bedtime as  needed (sleep).   ferrous sulfate 324 (65 Fe) MG TBEC Take 324 mg by mouth daily.    latanoprost (XALATAN) 0.005 % ophthalmic solution Place 1 drop into both eyes at bedtime.   MAGNESIUM PO Take 250 mg by mouth daily.    melatonin 5 MG TABS Take 5 mg by mouth at bedtime.   metoprolol tartrate (LOPRESSOR) 25 MG tablet Take 25 mg by mouth 2 (two) times daily.   montelukast (SINGULAIR) 10 MG tablet Take 10 mg by mouth daily.   Multiple Vitamin (MULTIVITAMIN) tablet Take 1 tablet by mouth daily.    nitroGLYCERIN (NITROSTAT) 0.4 MG SL tablet Place 0.4 mg under the tongue every 5 (five) minutes as needed for chest pain.   ondansetron (ZOFRAN) 4 MG tablet Take 4 mg by mouth every 8 (eight) hours as needed for nausea or vomiting.   oxybutynin (DITROPAN-XL) 10 MG 24 hr tablet Take 10 mg by mouth daily.   polyethylene glycol powder (GOODSENSE CLEARLAX) 17 GM/SCOOP powder Take 17 g by mouth daily.   torsemide (DEMADEX) 20 MG tablet Take 20 mg by mouth every other day. Then 40 mg every other day   XARELTO 15 MG TABS tablet TAKE 1 TABLET BY MOUTH ONCE DAILY WITH SUPPER     Allergies:   Flagyl [metronidazole], Penicillins, and Sulfamethoxazole-trimethoprim   Social History   Socioeconomic History   Marital status: Married    Spouse name: Not on file   Number of children: Not on file   Years of education: Not on file   Highest education level: Not on file  Occupational History   Not on file  Tobacco Use   Smoking status: Never   Smokeless tobacco: Never  Vaping Use   Vaping Use: Never used  Substance and Sexual Activity   Alcohol use: No   Drug use: No   Sexual activity: Not on file  Other Topics Concern   Not on file  Social History Narrative   Not on file   Social Determinants of Health   Financial Resource Strain: Not on file  Food Insecurity: Not on file  Transportation Needs: Not on file  Physical Activity: Not on file  Stress: Not on file  Social Connections: Not on file     Family History: The patient's family history includes Anxiety disorder in her sister; Asthma in her sister; Atrial fibrillation in her sister; Diabetes in her mother; GER disease in her sister; Heart failure in her mother; Hypertension in her brother. There is no history of Thyroid disease.  ROS:   Please see the history of present illness.    All other systems reviewed and are negative.  EKGs/Labs/Other Studies Reviewed:    The following studies were reviewed today: I discussed my findings  with the patient at length.   Recent Labs: 11/27/2021: BUN 32; Creatinine, Ser 1.46; Hemoglobin 13.6; Platelets 259; Potassium 4.4; Sodium 140  Recent Lipid Panel    Component Value Date/Time   CHOL 127 04/21/2019 0917   TRIG 164 (H) 04/21/2019 0917   HDL 33 (L) 04/21/2019 0917   CHOLHDL 3.8 04/21/2019 0917   LDLCALC 66 04/21/2019 0917    Physical Exam:    VS:  BP 136/88   Pulse 68   Ht 5\' 1"  (1.549 m)   Wt 160 lb 9.6 oz (72.8 kg)   SpO2 94%   BMI 30.35 kg/m     Wt Readings from Last 3 Encounters:  05/26/22 160 lb 9.6 oz (72.8 kg)  11/27/21 160  lb (72.6 kg)  04/04/21 171 lb (77.6 kg)     GEN: Patient is in no acute distress HEENT: Normal NECK: No JVD; No carotid bruits LYMPHATICS: No lymphadenopathy CARDIAC: Hear sounds regular, 2/6 systolic murmur at the apex. RESPIRATORY:  Clear to auscultation without rales, wheezing or rhonchi  ABDOMEN: Soft, non-tender, non-distended MUSCULOSKELETAL:  No edema; No deformity  SKIN: Warm and dry NEUROLOGIC:  Alert and oriented x 3 PSYCHIATRIC:  Normal affect   Signed, Garwin Brothers, MD  05/26/2022 12:43 PM    [facility]

## 2022-05-26 NOTE — Patient Instructions (Signed)

## 2022-06-16 ENCOUNTER — Telehealth: Payer: Self-pay

## 2022-06-16 ENCOUNTER — Telehealth: Payer: Self-pay | Admitting: Cardiology

## 2022-06-16 NOTE — Telephone Encounter (Signed)
  Pt c/o Shortness Of Breath: STAT if SOB developed within the last 24 hours or pt is noticeably SOB on the phone  1. Are you currently SOB (can you hear that pt is SOB on the phone)? No (pt is resting right now)  2. How long have you been experiencing SOB?  Friday afternoon.   3. Are you SOB when sitting or when up moving around? Moving around   4. Are you currently experiencing any other symptoms? Pt spouse calling stating pt has been very short winded and her oxygen levels are at 96%. Pt spouse denies any other symptoms

## 2022-06-16 NOTE — Telephone Encounter (Signed)
Mr. Caccavale called wanting to know if Mrs. Tartt could be worked in with Dr. Geraldo Pitter in the [facility] office this week. Sent to front desk for appt.

## 2022-06-16 NOTE — Telephone Encounter (Signed)
Spoke with spouse, Ulice Dash. He stated that the pt was not sleeping well and wakes up saying she is smothering. She has an appt with the PCP this morning. Spouse will call back if he has any questions or concerns.

## 2022-06-18 ENCOUNTER — Ambulatory Visit: Payer: PPO | Attending: Cardiology | Admitting: Cardiology

## 2022-06-18 ENCOUNTER — Telehealth: Payer: Self-pay

## 2022-06-18 ENCOUNTER — Encounter: Payer: Self-pay | Admitting: Cardiology

## 2022-06-18 VITALS — BP 150/82 | HR 76 | Ht 61.0 in | Wt 161.1 lb

## 2022-06-18 DIAGNOSIS — I10 Essential (primary) hypertension: Secondary | ICD-10-CM

## 2022-06-18 DIAGNOSIS — I4891 Unspecified atrial fibrillation: Secondary | ICD-10-CM

## 2022-06-18 DIAGNOSIS — Z86711 Personal history of pulmonary embolism: Secondary | ICD-10-CM | POA: Diagnosis not present

## 2022-06-18 DIAGNOSIS — R0602 Shortness of breath: Secondary | ICD-10-CM

## 2022-06-18 HISTORY — DX: Shortness of breath: R06.02

## 2022-06-18 NOTE — Telephone Encounter (Signed)
Per Dr. Julien Nordmann note double torsemide for 1 week and return to the office for VS nurse visit and labs. Spoke with Ulice Dash per DPR who verbalized understanding and had no additional questions.

## 2022-06-18 NOTE — Patient Instructions (Signed)
Medication Instructions:  Your physician recommends that you continue on your current medications as directed. Please refer to the Current Medication list given to you today.  *If you need a refill on your cardiac medications before your next appointment, please call your pharmacy*   Lab Work: Go to [facility] for labs. If you have labs (blood work) drawn today and your tests are completely normal, you will receive your results only by: MyChart Message (if you have MyChart) OR A paper copy in the mail If you have any lab test that is abnormal or we need to change your treatment, we will call you to review the results.   Testing/Procedures: Your physician has requested that you have a lexiscan myoview. For further information please visit [email or web address] Please follow instruction sheet, as given.  The test will take approximately 3 to 4 hours to complete; you may bring reading material.  If someone comes with you to your appointment, they will need to remain in the main lobby due to limited space in the testing area.   How to prepare for your Myocardial Perfusion Test: Do not eat or drink 3 hours prior to your test, except you may have water. Do not consume products containing caffeine (regular or decaffeinated) 12 hours prior to your test. (ex: coffee, chocolate, sodas, tea). Do bring a list of your current medications with you.  If not listed below, you may take your medications as normal. Do wear comfortable clothes (no dresses or overalls) and walking shoes, tennis shoes preferred (No heels or open toe shoes are allowed). Do NOT wear cologne, perfume, aftershave, or lotions (deodorant is allowed). If these instructions are not followed, your test will have to be rescheduled.    Follow-Up: At [facility], you and your health needs are our priority.  As part of our continuing mission to provide you with exceptional heart care, we have created designated Provider Care  Teams.  These Care Teams include your primary Cardiologist (physician) and Advanced Practice Providers (APPs -  Physician Assistants and Nurse Practitioners) who all work together to provide you with the care you need, when you need it.  We recommend signing up for the patient portal called "MyChart".  Sign up information is provided on this After Visit Summary.  MyChart is used to connect with patients for Virtual Visits (Telemedicine).  Patients are able to view lab/test results, encounter notes, upcoming appointments, etc.  Non-urgent messages can be sent to your provider as well.   To learn more about what you can do with MyChart, go to [email or web address].    Your next appointment:   1 month(s)  The format for your next appointment:   In Person  Provider:   Belva Crome, MD   Other Instructions Cardiac Nuclear Scan A cardiac nuclear scan is a test that is done to check the flow of blood to your heart. It is done when you are resting and when you are exercising. The test looks for problems such as: Not enough blood reaching a portion of the heart. The heart muscle not working as it should. You may need this test if: You have heart disease. You have had lab results that are not normal. You have had heart surgery or a balloon procedure to open up blocked arteries (angioplasty). You have chest pain. You have shortness of breath. In this test, a special dye (tracer) is put into your bloodstream. The tracer will travel to your heart. A camera will then take pictures of  your heart to see how the tracer moves through your heart. This test is usually done at a hospital and takes 2-4 hours. Tell a doctor about: Any allergies you have. All medicines you are taking, including vitamins, herbs, eye drops, creams, and over-the-counter medicines. Any problems you or family members have had with anesthetic medicines. Any blood disorders you have. Any surgeries you have had. Any medical  conditions you have. Whether you are pregnant or may be pregnant. What are the risks? Generally, this is a safe test. However, problems may occur, such as: Serious chest pain and heart attack. This is only a risk if the stress portion of the test is done. Rapid heartbeat. A feeling of warmth in your chest. This feeling usually does not last long. Allergic reaction to the tracer. What happens before the test? Ask your doctor about changing or stopping your normal medicines. This is important. Follow instructions from your doctor about what you cannot eat or drink. Remove your jewelry on the day of the test. What happens during the test? An IV tube will be inserted into one of your veins. Your doctor will give you a small amount of tracer through the IV tube. You will wait for 20-40 minutes while the tracer moves through your bloodstream. Your heart will be monitored with an electrocardiogram (ECG). You will lie down on an exam table. Pictures of your heart will be taken for about 15-20 minutes. You may also have a stress test. For this test, one of these things may be done: You will be asked to exercise on a treadmill or a stationary bike. You will be given medicines that will make your heart work harder. This is done if you are unable to exercise. When blood flow to your heart has peaked, a tracer will again be given through the IV tube. After 20-40 minutes, you will get back on the exam table. More pictures will be taken of your heart. Depending on the tracer that is used, more pictures may need to be taken 3-4 hours later. Your IV tube will be removed when the test is over. The test may vary among doctors and hospitals. What happens after the test? Ask your doctor: Whether you can return to your normal schedule, including diet, activities, and medicines. Whether you should drink more fluids. This will help to remove the tracer from your body. Drink enough fluid to keep your pee  (urine) pale yellow. Ask your doctor, or the department that is doing the test: When will my results be ready? How will I get my results? Summary A cardiac nuclear scan is a test that is done to check the flow of blood to your heart. Tell your doctor whether you are pregnant or may be pregnant. Before the test, ask your doctor about changing or stopping your normal medicines. This is important. Ask your doctor whether you can return to your normal activities. You may be asked to drink more fluids. This information is not intended to replace advice given to you by your health care provider. Make sure you discuss any questions you have with your health care provider. Document Revised: 11/24/2018 Document Reviewed: 01/18/2018 Elsevier Patient Education  [place].

## 2022-06-18 NOTE — Progress Notes (Signed)
Cardiology Office Note:    Date:  06/18/2022   ID:  Kerri Rogers, DOB 03-12-1935, MRN [id number]  PCP:  Guadalupe Maple, MD  Cardiologist:  Jenean Lindau, MD   Referring MD: Guadalupe Maple, MD    ASSESSMENT:    1. Essential hypertension   2. Atrial fibrillation, unspecified type ([place])   3. History of pulmonary embolism   4. Shortness of breath    PLAN:    In order of problems listed above:  Primary prevention stressed with the patient.  Importance of compliance with diet medication stressed and she vocalized understanding. Short of breath: Of unclear etiology.  I would like her to get blood work at [facility].  We will get a Chem-7 BNP and a D-dimer.  This is just to make sure that she does not have any issues with pulmonary embolism.  She is taking anticoagulation.  I will also do a Lexiscan sestamibi to assess for any objective evidence of coronary artery disease. Essential hypertension: Blood pressure is stable and diet was emphasized. Atrial fibrillation: On anticoagulation.  Compliance urged and she promises to do better. She to go to the nearest emergency room for any concerning symptoms.Patient will be seen in follow-up appointment in 6 months or earlier if the patient has any concerns    Medication Adjustments/Labs and Tests Ordered: Current medicines are reviewed at length with the patient today.  Concerns regarding medicines are outlined above.  No orders of the defined types were placed in this encounter.  No orders of the defined types were placed in this encounter.    No chief complaint on file.    History of Present Illness:    Kerri Rogers is a 87 y.o. female.  Patient has past medical history of essential hypertension, atrial fibrillation on anticoagulation.  Patient mentions to me that she takes Xarelto on a regular basis.  She gives history of shortness of breath and history that suggest PND.  I am not clear about her history but it is concerning.   At the time of my evaluation, the patient is alert awake oriented and in no distress.  Past Medical History:  Diagnosis Date   Asthma with bronchitis and status asthmaticus    Atrial fibrillation (HCC)    Atrial flutter, paroxysmal ([place]) 03/01/2015   B12 deficiency    Bladder spasm    CHF (congestive heart failure) (HCC)    Chronic constipation    COPD (chronic obstructive pulmonary disease) (HCC)    Edema    Essential hypertension 03/01/2015   GERD (gastroesophageal reflux disease)    Gout    Herniated lumbar intervertebral disc    History of pulmonary embolism 03/01/2015   Hypertension    Insomnia    Long term (current) use of anticoagulants    Overactive bladder    Peripheral autonomic neuropathy    Pre-ulcerative calluses    Skin rash    Stasis dermatitis    Ventral hernia    Vitamin D deficiency     Past Surgical History:  Procedure Laterality Date   ABDOMINAL HYSTERECTOMY     BLADDER SURGERY     CATARACT EXTRACTION     REPLACEMENT TOTAL KNEE BILATERAL     TONSILLECTOMY      Current Medications: Current Meds  Medication Sig   acetaminophen (TYLENOL) 500 MG tablet Take 1,000 mg by mouth 2 (two) times daily as needed for mild pain or moderate pain.   albuterol (VENTOLIN HFA) 108 (90 Base) MCG/ACT inhaler Inhale  1-2 puffs into the lungs every 4 (four) hours as needed for shortness of breath or wheezing.   digoxin (LANOXIN) 0.125 MG tablet Take 0.0625 mg by mouth daily.   diphenhydramine-acetaminophen (TYLENOL PM) 25-500 MG TABS tablet Take 2 tablets by mouth at bedtime as needed (sleep).   ferrous sulfate 324 (65 Fe) MG TBEC Take 324 mg by mouth daily.    latanoprost (XALATAN) 0.005 % ophthalmic solution Place 1 drop into both eyes at bedtime.   MAGNESIUM PO Take 250 mg by mouth daily.    melatonin 5 MG TABS Take 5 mg by mouth at bedtime.   metoprolol tartrate (LOPRESSOR) 25 MG tablet Take 25 mg by mouth 2 (two) times daily.   montelukast (SINGULAIR) 10 MG tablet  Take 10 mg by mouth daily.   Multiple Vitamin (MULTIVITAMIN) tablet Take 1 tablet by mouth daily.   nitrofurantoin, macrocrystal-monohydrate, (MACROBID) 100 MG capsule Take 100 mg by mouth 2 (two) times daily.   nitroGLYCERIN (NITROSTAT) 0.4 MG SL tablet Place 0.4 mg under the tongue every 5 (five) minutes as needed for chest pain.   ondansetron (ZOFRAN) 4 MG tablet Take 4 mg by mouth every 8 (eight) hours as needed for nausea or vomiting.   oxybutynin (DITROPAN-XL) 10 MG 24 hr tablet Take 10 mg by mouth daily.   polyethylene glycol powder (GOODSENSE CLEARLAX) 17 GM/SCOOP powder Take 17 g by mouth daily.   torsemide (DEMADEX) 20 MG tablet Take 40 mg by mouth daily.   XARELTO 15 MG TABS tablet TAKE 1 TABLET BY MOUTH ONCE DAILY WITH SUPPER     Allergies:   Flagyl [metronidazole], Penicillins, and Sulfamethoxazole-trimethoprim   Social History   Socioeconomic History   Marital status: Married    Spouse name: Not on file   Number of children: Not on file   Years of education: Not on file   Highest education level: Not on file  Occupational History   Not on file  Tobacco Use   Smoking status: Never   Smokeless tobacco: Never  Vaping Use   Vaping Use: Never used  Substance and Sexual Activity   Alcohol use: No   Drug use: No   Sexual activity: Not on file  Other Topics Concern   Not on file  Social History Narrative   Not on file   Social Determinants of Health   Financial Resource Strain: Not on file  Food Insecurity: Not on file  Transportation Needs: Not on file  Physical Activity: Not on file  Stress: Not on file  Social Connections: Not on file     Family History: The patient's family history includes Anxiety disorder in her sister; Asthma in her sister; Atrial fibrillation in her sister; Diabetes in her mother; GER disease in her sister; Heart failure in her mother; Hypertension in her brother. There is no history of Thyroid disease.  ROS:   Please see the history  of present illness.    All other systems reviewed and are negative.  EKGs/Labs/Other Studies Reviewed:    The following studies were reviewed today: I discussed my findings with the patient at length   Recent Labs: 11/27/2021: BUN 32; Creatinine, Ser 1.46; Hemoglobin 13.6; Platelets 259; Potassium 4.4; Sodium 140  Recent Lipid Panel    Component Value Date/Time   CHOL 127 04/21/2019 0917   TRIG 164 (H) 04/21/2019 0917   HDL 33 (L) 04/21/2019 0917   CHOLHDL 3.8 04/21/2019 0917   LDLCALC 66 04/21/2019 0917    Physical Exam:  VS:  BP (!) 150/82   Pulse 76   Ht 5\' 1"  (1.549 m)   Wt 161 lb 1.9 oz (73.1 kg)   SpO2 93%   BMI 30.44 kg/m     Wt Readings from Last 3 Encounters:  06/18/22 161 lb 1.9 oz (73.1 kg)  05/26/22 160 lb 9.6 oz (72.8 kg)  11/27/21 160 lb (72.6 kg)     GEN: Patient is in no acute distress HEENT: Normal NECK: No JVD; No carotid bruits LYMPHATICS: No lymphadenopathy CARDIAC: Hear sounds regular, 2/6 systolic murmur at the apex. RESPIRATORY:  Clear to auscultation without rales, wheezing or rhonchi  ABDOMEN: Soft, non-tender, non-distended MUSCULOSKELETAL:  No edema; No deformity  SKIN: Warm and dry NEUROLOGIC:  Alert and oriented x 3 PSYCHIATRIC:  Normal affect   Signed, Jenean Lindau, MD  06/18/2022 10:39 AM    [place]

## 2022-06-19 ENCOUNTER — Telehealth (HOSPITAL_COMMUNITY): Payer: Self-pay

## 2022-06-19 ENCOUNTER — Telehealth: Payer: Self-pay

## 2022-06-19 ENCOUNTER — Telehealth: Payer: Self-pay | Admitting: *Deleted

## 2022-06-19 NOTE — Telephone Encounter (Signed)
Notified Shonda Freeman ok to activate itamar device.  

## 2022-06-19 NOTE — Telephone Encounter (Signed)
Will call pt once itamar's are instock as she has been approved.

## 2022-06-19 NOTE — Telephone Encounter (Signed)
Spoke with the patient, detailed instructions given. She stated that she would be here for her test. Asked to call back with any questions. S.Saban Heinlen EMTP 

## 2022-06-23 NOTE — Telephone Encounter (Signed)
Aware that the itamar is ready for pick up.

## 2022-06-25 ENCOUNTER — Ambulatory Visit: Payer: PPO | Attending: Cardiology

## 2022-06-25 VITALS — BP 128/72 | HR 74 | Ht 61.0 in | Wt 159.6 lb

## 2022-06-25 DIAGNOSIS — R0602 Shortness of breath: Secondary | ICD-10-CM

## 2022-06-25 DIAGNOSIS — I509 Heart failure, unspecified: Secondary | ICD-10-CM | POA: Diagnosis not present

## 2022-06-25 NOTE — Progress Notes (Signed)
   Nurse Visit   Date of Encounter: 06/25/2022 ID: Kerri Rogers, DOB [birth year], MRN [id number]  PCP:  Judge Stall, MD   [facility] Providers Cardiologist:  None      Visit Details   VS:  There were no vitals taken for this visit. , BMI There is no height or weight on file to calculate BMI.  Wt Readings from Last 3 Encounters:  06/18/22 161 lb 1.9 oz (73.1 kg)  05/26/22 160 lb 9.6 oz (72.8 kg)  11/27/21 160 lb (72.6 kg)     Reason for visit: Follow up VS and lab after Torsemide doubled 1 week ago per Dr. Tomie China.  Performed today: Vitals, Labs as ordered by Dr. Tomie China. Dr. Bing Matter consulted and spoke with pt. Added ProBNP, Echo and cancelled stress test scheduled for tomorrow.  Changes (medications, testing, etc.) : No changes Length of Visit: 15 minutes    Medications Adjustments/Labs and Tests Ordered: No orders of the defined types were placed in this encounter.  No orders of the defined types were placed in this encounter.    Signed, Neena Rhymes, RN  06/25/2022 10:11 AM

## 2022-06-26 ENCOUNTER — Ambulatory Visit: Payer: PPO

## 2022-06-26 LAB — BASIC METABOLIC PANEL
BUN/Creatinine Ratio: 25 (ref 12–28)
BUN: 38 mg/dL — ABNORMAL HIGH (ref 8–27)
CO2: 29 mmol/L (ref 20–29)
Calcium: 10.4 mg/dL — ABNORMAL HIGH (ref 8.7–10.3)
Chloride: 100 mmol/L (ref 96–106)
Creatinine, Ser: 1.54 mg/dL — ABNORMAL HIGH (ref 0.57–1.00)
Glucose: 100 mg/dL — ABNORMAL HIGH (ref 70–99)
Potassium: 4.1 mmol/L (ref 3.5–5.2)
Sodium: 141 mmol/L (ref 134–144)
eGFR: 33 mL/min/{1.73_m2} — ABNORMAL LOW (ref >59)

## 2022-06-26 LAB — PRO B NATRIURETIC PEPTIDE: NT-Pro BNP: 5072 pg/mL — ABNORMAL HIGH (ref 0–738)

## 2022-06-27 ENCOUNTER — Telehealth: Payer: Self-pay

## 2022-06-27 DIAGNOSIS — I509 Heart failure, unspecified: Secondary | ICD-10-CM

## 2022-06-27 NOTE — Telephone Encounter (Signed)
Spoke with Spouse, Vonna Kotyk, Per DPR, per Dr. Vanetta Shawl note. Agreed to take Torsemide 40mg  in AM 20mg  in PM and have BMP in 1 week. Verbalized understanding and had no further questions.

## 2022-06-28 ENCOUNTER — Encounter (HOSPITAL_BASED_OUTPATIENT_CLINIC_OR_DEPARTMENT_OTHER): Payer: PPO | Admitting: Cardiology

## 2022-06-28 DIAGNOSIS — G4733 Obstructive sleep apnea (adult) (pediatric): Secondary | ICD-10-CM

## 2022-07-08 ENCOUNTER — Ambulatory Visit: Payer: PPO | Attending: Cardiology

## 2022-07-08 DIAGNOSIS — R0602 Shortness of breath: Secondary | ICD-10-CM

## 2022-07-08 DIAGNOSIS — I509 Heart failure, unspecified: Secondary | ICD-10-CM

## 2022-07-08 LAB — ECHOCARDIOGRAM COMPLETE
Area-P 1/2: 3.54 cm2
Calc EF: 38.9 %
MV M vel: 5.34 m/s
MV Peak grad: 114.1 mmHg
Radius: 0.6 cm
S' Lateral: 4.4 cm
Single Plane A2C EF: 39.3 %
Single Plane A4C EF: 42.5 %

## 2022-07-15 ENCOUNTER — Telehealth: Payer: Self-pay

## 2022-07-15 LAB — BASIC METABOLIC PANEL
BUN/Creatinine Ratio: 21 (ref 12–28)
BUN: 34 mg/dL — ABNORMAL HIGH (ref 8–27)
CO2: 25 mmol/L (ref 20–29)
Calcium: 10.5 mg/dL — ABNORMAL HIGH (ref 8.7–10.3)
Chloride: 105 mmol/L (ref 96–106)
Creatinine, Ser: 1.64 mg/dL — ABNORMAL HIGH (ref 0.57–1.00)
Glucose: 103 mg/dL — ABNORMAL HIGH (ref 70–99)
Potassium: 4.1 mmol/L (ref 3.5–5.2)
Sodium: 146 mmol/L — ABNORMAL HIGH (ref 134–144)
eGFR: 30 mL/min/{1.73_m2} — ABNORMAL LOW (ref >59)

## 2022-07-15 NOTE — Telephone Encounter (Signed)
Spoke with Souse per DPR. He stated that Kerri Rogers was completed and they have not heard any results. Test was entered under Dr. Tresa Endo instead of Dr. Mayford Knife. Sent message to Jim Like, RN for assistance.

## 2022-07-17 ENCOUNTER — Telehealth: Payer: Self-pay

## 2022-07-17 NOTE — Telephone Encounter (Signed)
Patient notified of results and recommendations and is scheduled in January.

## 2022-07-17 NOTE — Telephone Encounter (Signed)
-----   Message from Georgeanna Lea, MD sent at 07/17/2022  9:30 AM EST ----- Chem-7 acceptable, continue present management, she needs to have follow-up appointment with within next 3 to 4 weeks

## 2022-07-18 ENCOUNTER — Ambulatory Visit: Payer: PPO | Attending: Cardiology

## 2022-07-18 DIAGNOSIS — I4891 Unspecified atrial fibrillation: Secondary | ICD-10-CM

## 2022-07-18 NOTE — Procedures (Signed)
SLEEP STUDY REPORT Patient Information Study Date: 06/28/22 Patient Name: Kerri Rogers Patient ID: [id number] Birth Date: [birth year] Age: 87 Gender: Female BMI: 30.4 (W=161 lb, H=5' 1'') Referring Physician: Jyl Heinz, MD  TEST DESCRIPTION: Home sleep apnea testing was completed using the WatchPat, a Type 1 device, utilizing peripheral arterial tonometry (PAT), chest movement, actigraphy, pulse oximetry, pulse rate, body position and snore.  AHI was calculated with apnea and hypopnea using valid sleep time as the denominator. RDI includes apneas, hypopneas, and RERAs.  The data acquired and the scoring of sleep and all associated events were performed in accordance with the recommended standards and specifications as outlined in the AASM Manual for the Scoring of Sleep and Associated Events 2.2.0 (2015).  FINDINGS:  1.  Severe Obstructive Sleep Apnea with AHI 64.1/hr.   2.  Moderate Central Sleep Apnea with pAHIc 19.9/hr.  3.  Oxygen desaturations as low as 83%.  4.  Minimal snoring was present. O2 sats were < 88% for 83.9 min.  5.  Total sleep time was 7 hrs and 35 min.  6.  11.8% of total sleep time was spent in REM sleep.   7.  Shortened sleep onset latency at 7 min.   8.  Prolonged REM sleep onset latency at 205 min.   9.  Total awakenings were 12.  10. Arrhythmia detection:  Suggestive of possible brief atrial fibrillation lasting 8 hours 52 min and 56seconds.  This is not diagnostic and further testing with outpatient telemetry monitoring is recommended.  DIAGNOSIS:   Severe Obstructive Sleep Apnea (G47.33) Moderate Central Sleep Apnea Nocturnal Hypoxemia  RECOMMENDATIONS:   1.  Clinical correlation of these findings is necessary.  The decision to treat obstructive sleep apnea (OSA) is usually based on the presence of apnea symptoms or the presence of associated medical conditions such as Hypertension, Congestive Heart Failure, Atrial Fibrillation or Obesity.   The most common symptoms of OSA are snoring, gasping for breath while sleeping, daytime sleepiness and fatigue.   2.  Initiating apnea therapy is recommended given the presence of symptoms and/or associated conditions. Recommend proceeding with one of the following:     a.  Auto-CPAP therapy with a pressure range of 5-20cm H2O.     b.  An oral appliance (OA) that can be obtained from certain dentists with expertise in sleep medicine.  These are primarily of use in non-obese patients with mild and moderate disease.     c.  An ENT consultation which may be useful to look for specific causes of obstruction and possible treatment options.     d.  If patient is intolerant to PAP therapy, consider referral to ENT for evaluation for hypoglossal nerve stimulator.   3.  Close follow-up is necessary to ensure success with CPAP or oral appliance therapy for maximum benefit.  4.  A follow-up oximetry study on CPAP is recommended to assess the adequacy of therapy and determine the need for supplemental oxygen or the potential need for Bi-level therapy.  An arterial blood gas to determine the adequacy of baseline ventilation and oxygenation should also be considered.  5.  Healthy sleep recommendations include:  adequate nightly sleep (normal 7-9 hrs/night), avoidance of caffeine after noon and alcohol near bedtime, and maintaining a sleep environment that is cool, dark and quiet.  6.  Weight loss for overweight patients is recommended.  Even modest amounts of weight loss can significantly improve the severity of sleep apnea.  7.  Snoring recommendations include:  weight loss where appropriate, side sleeping, and avoidance of alcohol before bed.  8.  Operation of motor vehicle should be avoided when sleepy.  Signature:   Armanda Magic, MD; [facility]; Diplomat, American Board of Sleep Medicine Electronically Signed: 07/18/2022

## 2022-07-21 ENCOUNTER — Encounter: Payer: Self-pay | Admitting: Cardiology

## 2022-07-21 ENCOUNTER — Ambulatory Visit: Payer: PPO | Attending: Cardiology | Admitting: Cardiology

## 2022-07-21 VITALS — BP 134/80 | HR 60 | Ht 61.0 in | Wt 165.8 lb

## 2022-07-21 DIAGNOSIS — I340 Nonrheumatic mitral (valve) insufficiency: Secondary | ICD-10-CM

## 2022-07-21 DIAGNOSIS — I10 Essential (primary) hypertension: Secondary | ICD-10-CM | POA: Diagnosis not present

## 2022-07-21 DIAGNOSIS — Z86711 Personal history of pulmonary embolism: Secondary | ICD-10-CM

## 2022-07-21 DIAGNOSIS — I4891 Unspecified atrial fibrillation: Secondary | ICD-10-CM | POA: Diagnosis not present

## 2022-07-21 HISTORY — DX: Nonrheumatic mitral (valve) insufficiency: I34.0

## 2022-07-21 NOTE — Patient Instructions (Addendum)
Medication Instructions:  Your physician recommends that you continue on your current medications as directed. Please refer to the Current Medication list given to you today.  *If you need a refill on your cardiac medications before your next appointment, please call your pharmacy*   Lab Work: None Ordered If you have labs (blood work) drawn today and your tests are completely normal, you will receive your results only by: MyChart Message (if you have MyChart) OR A paper copy in the mail If you have any lab test that is abnormal or we need to change your treatment, we will call you to review the results.   Testing/Procedures: None Ordered   Follow-Up: At [facility], you and your health needs are our priority.  As part of our continuing mission to provide you with exceptional heart care, we have created designated Provider Care Teams.  These Care Teams include your primary Cardiologist (physician) and Advanced Practice Providers (APPs -  Physician Assistants and Nurse Practitioners) who all work together to provide you with the care you need, when you need it.  We recommend signing up for the patient portal called "MyChart".  Sign up information is provided on this After Visit Summary.  MyChart is used to connect with patients for Virtual Visits (Telemedicine).  Patients are able to view lab/test results, encounter notes, upcoming appointments, etc.  Non-urgent messages can be sent to your provider as well.   To learn more about what you can do with MyChart, go to [email or web address].    Your next appointment:   6 month(s)  The format for your next appointment:   In Person  Provider:   Belva Crome, MD    Other Instructions Referral to [facility]- They will call for appt.

## 2022-07-21 NOTE — Progress Notes (Signed)
Cardiology Office Note:    Date:  07/21/2022   ID:  Kerri Rogers, DOB 14-May-1935, MRN [id number]  PCP:  Judge Stall, MD  Cardiologist:  Garwin Brothers, MD   Referring MD: Judge Stall, MD    ASSESSMENT:    1. Atrial fibrillation, unspecified type (HCC)   2. Essential hypertension   3. History of pulmonary embolism   4. Severe mitral regurgitation    PLAN:    In order of problems listed above:  Primary prevention stressed with the patient.  Importance of compliance with diet medication stressed and she vocalized understanding. Dyspnea on exertion: Her echocardiogram reveals moderate to severe mitral regurgitation with biatrial enlargement.  She was short of breath when she came for the stress test and her partner canceled the stress test.  In view of these complex issues I would like to to refer her to our structural heart disease clinic.  Hopefully they may have some suggestions to help her quality of life. Snoring: She is has sleep study and we are awaiting results. She also has an appointment with her pulmonologist for evaluation for dyspnea. She has significant renal insufficiency which poses a challenge to treatment.  She tells me that diuretics do not help her vital edema. Patient will be seen in follow-up appointment in 6 months or earlier if the patient has any concerns    Medication Adjustments/Labs and Tests Ordered: Current medicines are reviewed at length with the patient today.  Concerns regarding medicines are outlined above.  No orders of the defined types were placed in this encounter.  No orders of the defined types were placed in this encounter.    No chief complaint on file.    History of Present Illness:    Kerri Rogers is a 87 y.o. female.  Patient has past medical history of atrial fibrillation, essential hypertension, moderate to severe mitral regurgitation on recent echocardiogram.  She is a frail lady.  She is 87 years old.  She has renal  insufficiency.  She denies any chest pain orthopnea or PND.  She has issues with dyspnea.  At the time of my evaluation, the patient is alert awake oriented and in no distress.  Past Medical History:  Diagnosis Date   Asthma with bronchitis and status asthmaticus    Atrial fibrillation (HCC)    Atrial flutter, paroxysmal (HCC) 03/01/2015   B12 deficiency    Bladder spasm    CHF (congestive heart failure) (HCC)    Chronic constipation    COPD (chronic obstructive pulmonary disease) (HCC)    Edema    Essential hypertension 03/01/2015   GERD (gastroesophageal reflux disease)    Gout    Herniated lumbar intervertebral disc    History of pulmonary embolism 03/01/2015   Hypertension    Insomnia    Long term (current) use of anticoagulants    Overactive bladder    Peripheral autonomic neuropathy    Pre-ulcerative calluses    Shortness of breath 06/18/2022   Skin rash    Stasis dermatitis    Ventral hernia    Vitamin D deficiency     Past Surgical History:  Procedure Laterality Date   ABDOMINAL HYSTERECTOMY     BLADDER SURGERY     CATARACT EXTRACTION     REPLACEMENT TOTAL KNEE BILATERAL     TONSILLECTOMY      Current Medications: Current Meds  Medication Sig   acetaminophen (TYLENOL) 500 MG tablet Take 1,000 mg by mouth 2 (two) times daily as needed  for mild pain or moderate pain.   albuterol (VENTOLIN HFA) 108 (90 Base) MCG/ACT inhaler Inhale 1-2 puffs into the lungs every 4 (four) hours as needed for shortness of breath or wheezing.   benzonatate (TESSALON) 100 MG capsule Take 100 mg by mouth 3 (three) times daily as needed for cough.   digoxin (LANOXIN) 0.125 MG tablet Take 0.0625 mg by mouth daily.   diphenhydramine-acetaminophen (TYLENOL PM) 25-500 MG TABS tablet Take 2 tablets by mouth at bedtime as needed (sleep).   ferrous sulfate 324 (65 Fe) MG TBEC Take 324 mg by mouth daily.    fluticasone (FLONASE) 50 MCG/ACT nasal spray Place 2 sprays into both nostrils daily.    latanoprost (XALATAN) 0.005 % ophthalmic solution Place 1 drop into both eyes at bedtime.   MAGNESIUM PO Take 250 mg by mouth daily.    melatonin 5 MG TABS Take 5 mg by mouth at bedtime.   metoprolol tartrate (LOPRESSOR) 25 MG tablet Take 25 mg by mouth 2 (two) times daily.   montelukast (SINGULAIR) 10 MG tablet Take 10 mg by mouth daily.   Multiple Vitamin (MULTIVITAMIN) tablet Take 1 tablet by mouth daily.   nitrofurantoin, macrocrystal-monohydrate, (MACROBID) 100 MG capsule Take 100 mg by mouth 2 (two) times daily.   nitroGLYCERIN (NITROSTAT) 0.4 MG SL tablet Place 0.4 mg under the tongue every 5 (five) minutes as needed for chest pain.   ondansetron (ZOFRAN) 4 MG tablet Take 4 mg by mouth every 8 (eight) hours as needed for nausea or vomiting.   oxybutynin (DITROPAN-XL) 10 MG 24 hr tablet Take 10 mg by mouth daily.   polyethylene glycol powder (GOODSENSE CLEARLAX) 17 GM/SCOOP powder Take 17 g by mouth daily.   torsemide (DEMADEX) 20 MG tablet Take 40 mg by mouth daily. 40mg  in the AM , 20mg  in the PM   XARELTO 15 MG TABS tablet TAKE 1 TABLET BY MOUTH ONCE DAILY WITH SUPPER     Allergies:   Flagyl [metronidazole], Penicillins, and Sulfamethoxazole-trimethoprim   Social History   Socioeconomic History   Marital status: Married    Spouse name: Not on file   Number of children: Not on file   Years of education: Not on file   Highest education level: Not on file  Occupational History   Not on file  Tobacco Use   Smoking status: Never   Smokeless tobacco: Never  Vaping Use   Vaping Use: Never used  Substance and Sexual Activity   Alcohol use: No   Drug use: No   Sexual activity: Not on file  Other Topics Concern   Not on file  Social History Narrative   Not on file   Social Determinants of Health   Financial Resource Strain: Not on file  Food Insecurity: Not on file  Transportation Needs: Not on file  Physical Activity: Not on file  Stress: Not on file  Social  Connections: Not on file     Family History: The patient's family history includes Anxiety disorder in her sister; Asthma in her sister; Atrial fibrillation in her sister; Diabetes in her mother; GER disease in her sister; Heart failure in her mother; Hypertension in her brother. There is no history of Thyroid disease.  ROS:   Please see the history of present illness.    All other systems reviewed and are negative.  EKGs/Labs/Other Studies Reviewed:    The following studies were reviewed today: I discussed my findings with the patient at length.   Recent Labs: 11/27/2021:  Hemoglobin 13.6; Platelets 259 06/25/2022: NT-Pro BNP 5,072 07/14/2022: BUN 34; Creatinine, Ser 1.64; Potassium 4.1; Sodium 146  Recent Lipid Panel    Component Value Date/Time   CHOL 127 04/21/2019 0917   TRIG 164 (H) 04/21/2019 0917   HDL 33 (L) 04/21/2019 0917   CHOLHDL 3.8 04/21/2019 0917   LDLCALC 66 04/21/2019 0917    Physical Exam:    VS:  BP 134/80   Pulse 60   Ht 5\' 1"  (1.549 m)   Wt 165 lb 12.8 oz (75.2 kg)   SpO2 96%   BMI 31.33 kg/m     Wt Readings from Last 3 Encounters:  07/21/22 165 lb 12.8 oz (75.2 kg)  06/25/22 159 lb 9.6 oz (72.4 kg)  06/18/22 161 lb 1.9 oz (73.1 kg)     GEN: Patient is in no acute distress HEENT: Normal NECK: No JVD; No carotid bruits LYMPHATICS: No lymphadenopathy CARDIAC: Hear sounds regular, 2/6 systolic murmur at the apex. RESPIRATORY:  Clear to auscultation without rales, wheezing or rhonchi  ABDOMEN: Soft, non-tender, non-distended MUSCULOSKELETAL:  2plusPedal edema; No deformity  SKIN: Warm and dry NEUROLOGIC:  Alert and oriented x 3 PSYCHIATRIC:  Normal affect   Signed, 13/01/23, MD  07/21/2022 2:01 PM    [facility]

## 2022-07-23 ENCOUNTER — Telehealth: Payer: Self-pay | Admitting: *Deleted

## 2022-07-23 NOTE — Telephone Encounter (Signed)
-----   Message from Quintella Reichert, MD sent at 07/18/2022 11:24 AM EST ----- Please let patient know that they have sleep apnea.  Recommend therapeutic CPAP titration for treatment of patient's sleep disordered breathing.  If unable to perform an in lab titration then initiate ResMed auto CPAP from 4 to 15cm H2O with heated humidity and mask of choice and overnight pulse ox on CPAP.

## 2022-07-23 NOTE — Telephone Encounter (Signed)
Left message to return a call to me to discuss sleep study results. °

## 2022-07-25 ENCOUNTER — Other Ambulatory Visit: Payer: Self-pay | Admitting: Cardiology

## 2022-07-25 DIAGNOSIS — G4733 Obstructive sleep apnea (adult) (pediatric): Secondary | ICD-10-CM

## 2022-07-25 DIAGNOSIS — G4736 Sleep related hypoventilation in conditions classified elsewhere: Secondary | ICD-10-CM

## 2022-07-25 NOTE — Telephone Encounter (Signed)
Husband returned a call to me and was given sleep study results and recommendations. He informed me that the patient is currently in the hospital and they noticed that she was having symptoms and placed her on a CPAP and she slept much better. He agrees to having her to move forward with CPAP titration study recommended by Dr Mayford Knife. HTA approval # [id number]. Valid dates 07/25/22 to 10/23/22.

## 2022-07-26 DIAGNOSIS — F4329 Adjustment disorder with other symptoms: Secondary | ICD-10-CM

## 2022-07-26 HISTORY — DX: Adjustment disorder with other symptoms: F43.29

## 2022-07-28 DIAGNOSIS — I428 Other cardiomyopathies: Secondary | ICD-10-CM | POA: Insufficient documentation

## 2022-07-28 DIAGNOSIS — I272 Pulmonary hypertension, unspecified: Secondary | ICD-10-CM | POA: Insufficient documentation

## 2022-07-28 DIAGNOSIS — I2510 Atherosclerotic heart disease of native coronary artery without angina pectoris: Secondary | ICD-10-CM

## 2022-07-28 HISTORY — DX: Pulmonary hypertension, unspecified: I27.20

## 2022-07-28 HISTORY — DX: Other cardiomyopathies: I42.8

## 2022-07-28 HISTORY — DX: Atherosclerotic heart disease of native coronary artery without angina pectoris: I25.10

## 2022-08-05 ENCOUNTER — Telehealth: Payer: Self-pay

## 2022-08-05 NOTE — Telephone Encounter (Signed)
Received referral for MR from Dr. Tomie China, but the patient was admitted to another local hospital.   Called the patient for an update and to schedule consult if not being followed by [facility].  Left message to call back.

## 2022-08-06 NOTE — Telephone Encounter (Signed)
Left message to call back  

## 2022-08-13 NOTE — Telephone Encounter (Signed)
Spoke with the patient's husband in detail and offered appointment with the Valve Team to discuss MR treatment options.  He said that at this time, they are extremely overwhelmed and he does not want to schedule a consult.  Kerri Rogers is scheduled for a follow-up at Dr. Kem Parkinson office 1/8.  Instructed him to keep that appointment and discuss MR with Dr. Bing Matter.  He understands I will follow up with them after that appointment to arrange a consult if they wish. He was grateful for call and agreed with plan.

## 2022-08-19 ENCOUNTER — Encounter (HOSPITAL_BASED_OUTPATIENT_CLINIC_OR_DEPARTMENT_OTHER): Payer: PPO | Admitting: Cardiology

## 2022-08-22 ENCOUNTER — Other Ambulatory Visit: Payer: Self-pay

## 2022-08-22 DIAGNOSIS — I4729 Other ventricular tachycardia: Secondary | ICD-10-CM

## 2022-08-22 HISTORY — DX: Other ventricular tachycardia: I47.29

## 2022-08-25 ENCOUNTER — Telehealth: Payer: Self-pay

## 2022-08-25 ENCOUNTER — Ambulatory Visit: Payer: PPO | Attending: Cardiology | Admitting: Cardiology

## 2022-08-25 VITALS — BP 128/60 | HR 77 | Ht 61.0 in | Wt 162.0 lb

## 2022-08-25 DIAGNOSIS — I340 Nonrheumatic mitral (valve) insufficiency: Secondary | ICD-10-CM

## 2022-08-25 DIAGNOSIS — I4821 Permanent atrial fibrillation: Secondary | ICD-10-CM

## 2022-08-25 DIAGNOSIS — R0609 Other forms of dyspnea: Secondary | ICD-10-CM

## 2022-08-25 DIAGNOSIS — I272 Pulmonary hypertension, unspecified: Secondary | ICD-10-CM

## 2022-08-25 DIAGNOSIS — R0602 Shortness of breath: Secondary | ICD-10-CM

## 2022-08-25 DIAGNOSIS — I10 Essential (primary) hypertension: Secondary | ICD-10-CM | POA: Diagnosis not present

## 2022-08-25 DIAGNOSIS — I2510 Atherosclerotic heart disease of native coronary artery without angina pectoris: Secondary | ICD-10-CM | POA: Diagnosis not present

## 2022-08-25 MED ORDER — HYDROXYZINE HCL 25 MG PO TABS: 25.0000 mg | ORAL_TABLET | Freq: Four times a day (QID) | ORAL | 3 refills | Status: DC | PRN

## 2022-08-25 NOTE — Progress Notes (Signed)
Cardiology Office Note:    Date:  08/25/2022   ID:  Kerri Rogers, DOB 1934/08/28, MRN [id number]  PCP:  Judge Stall, MD  Cardiologist:  Gypsy Balsam, MD    Referring MD: Judge Stall, MD   Chief Complaint  Patient presents with   Follow-up    History of Present Illness:    Kerri Rogers is a 88 y.o. female with quite complex past medical history that include cardiomyopathy latest estimation of left ventricle ejection fraction 35 to 40%, moderate to severe mitral regurgitation, she also had a cardiac catheterization done at the beginning of December 2023 which showed 25% of left main, 40% proximal LAD 30% mid 30% distal LAD, 50% circumflex, 30% RCA, right side cardiac catheterization show cardiac output 2.59 cardiac index 1.5, pulm artery pressure was 61/33 with mean pressure of 44 mmHg.  She had started having conversation about potentially mitral valve clip however they wanted to talk to me and find my opinion about potential procedure.  She was admitted actually for congestive heart failure.  She was diuresed however lost only 5 pounds.  Additional problem include kidney dysfunction with creatinine neighborhood of 1.6.,  However last Chem-7 I have is from 13 December with GFR for 48 and creatinine 1.13. She is in my office today for follow-up.  She is doing fair.  Shortness of breath is better but swelling of lower extremities is still 3+, she is sleeps in the chair she has been doing this for years.  She was also discovered to have obstructive sleep apnea she does use CPAP mask which seems to be helping with sleeping.  She walks around with a walker and actually her husband asking me if I can give him prescription for scooter.  Denies have any chest pain tightness squeezing pressure burning chest.  Past Medical History:  Diagnosis Date   Asthma with bronchitis and status asthmaticus    Atrial fibrillation (HCC)    Atrial flutter, paroxysmal (HCC) 03/01/2015   B12 deficiency     Bladder spasm    CHF (congestive heart failure) (HCC)    Chronic constipation    COPD (chronic obstructive pulmonary disease) (HCC)    Edema    Essential hypertension 03/01/2015   GERD (gastroesophageal reflux disease)    Gout    Herniated lumbar intervertebral disc    History of pulmonary embolism 03/01/2015   Hypertension    Insomnia    Long term (current) use of anticoagulants    Overactive bladder    Peripheral autonomic neuropathy    Pre-ulcerative calluses    Shortness of breath 06/18/2022   Skin rash    Stasis dermatitis    Ventral hernia    Vitamin D deficiency     Past Surgical History:  Procedure Laterality Date   ABDOMINAL HYSTERECTOMY     BLADDER SURGERY     CATARACT EXTRACTION     REPLACEMENT TOTAL KNEE BILATERAL     TONSILLECTOMY      Current Medications: Current Meds  Medication Sig   acetaminophen (TYLENOL) 500 MG tablet Take 1,000 mg by mouth 2 (two) times daily as needed for mild pain or moderate pain.   albuterol (VENTOLIN HFA) 108 (90 Base) MCG/ACT inhaler Inhale 1-2 puffs into the lungs every 4 (four) hours as needed for shortness of breath or wheezing.   benzonatate (TESSALON) 100 MG capsule Take 100 mg by mouth 3 (three) times daily as needed for cough.   dapagliflozin propanediol (FARXIGA) 5 MG TABS tablet Take 5 mg  by mouth daily.   digoxin (LANOXIN) 0.125 MG tablet Take 0.0625 mg by mouth daily.   diphenhydramine-acetaminophen (TYLENOL PM) 25-500 MG TABS tablet Take 2 tablets by mouth at bedtime as needed (sleep).   ferrous gluconate (FERGON) 324 MG tablet Take 324 mg by mouth daily with breakfast.   ferrous sulfate 324 (65 Fe) MG TBEC Take 324 mg by mouth daily.    fluticasone (FLONASE) 50 MCG/ACT nasal spray Place 2 sprays into both nostrils daily.   hydrOXYzine (ATARAX) 25 MG tablet Take 25 mg by mouth every 6 (six) hours as needed for anxiety or itching.   latanoprost (XALATAN) 0.005 % ophthalmic solution Place 1 drop into both eyes at  bedtime.   magnesium oxide (MAG-OX) 400 MG tablet Take 400 mg by mouth daily.   MAGNESIUM PO Take 250 mg by mouth daily.    melatonin 5 MG TABS Take 5 mg by mouth at bedtime.   metoprolol tartrate (LOPRESSOR) 25 MG tablet Take 25 mg by mouth 2 (two) times daily.   montelukast (SINGULAIR) 10 MG tablet Take 10 mg by mouth daily.   Multiple Vitamin (MULTIVITAMIN) tablet Take 1 tablet by mouth daily.   nitrofurantoin, macrocrystal-monohydrate, (MACROBID) 100 MG capsule Take 100 mg by mouth 2 (two) times daily.   nitroGLYCERIN (NITROSTAT) 0.4 MG SL tablet Place 0.4 mg under the tongue every 5 (five) minutes as needed for chest pain.   ondansetron (ZOFRAN) 4 MG tablet Take 4 mg by mouth every 8 (eight) hours as needed for nausea or vomiting.   oxybutynin (DITROPAN-XL) 10 MG 24 hr tablet Take 10 mg by mouth daily.   polyethylene glycol powder (GOODSENSE CLEARLAX) 17 GM/SCOOP powder Take 17 g by mouth daily.   torsemide (DEMADEX) 20 MG tablet Take 40 mg by mouth daily. 40mg  in the AM , 20mg  in the PM   XARELTO 15 MG TABS tablet TAKE 1 TABLET BY MOUTH ONCE DAILY WITH SUPPER     Allergies:   Flagyl [metronidazole], Penicillins, and Sulfamethoxazole-trimethoprim   Social History   Socioeconomic History   Marital status: Married    Spouse name: Not on file   Number of children: Not on file   Years of education: Not on file   Highest education level: Not on file  Occupational History   Not on file  Tobacco Use   Smoking status: Never   Smokeless tobacco: Never  Vaping Use   Vaping Use: Never used  Substance and Sexual Activity   Alcohol use: No   Drug use: No   Sexual activity: Not on file  Other Topics Concern   Not on file  Social History Narrative   Not on file   Social Determinants of Health   Financial Resource Strain: Not on file  Food Insecurity: Not on file  Transportation Needs: Not on file  Physical Activity: Not on file  Stress: Not on file  Social Connections: Not on  file     Family History: The patient's family history includes Anxiety disorder in her sister; Asthma in her sister; Atrial fibrillation in her sister; Diabetes in her mother; GER disease in her sister; Heart failure in her mother; Hypertension in her brother. There is no history of Thyroid disease. ROS:   Please see the history of present illness.    All 14 point review of systems negative except as described per history of present illness  EKGs/Labs/Other Studies Reviewed:      Recent Labs: 11/27/2021: Hemoglobin 13.6; Platelets 259 06/25/2022: NT-Pro BNP  5,072 07/14/2022: BUN 34; Creatinine, Ser 1.64; Potassium 4.1; Sodium 146  Recent Lipid Panel    Component Value Date/Time   CHOL 127 04/21/2019 0917   TRIG 164 (H) 04/21/2019 0917   HDL 33 (L) 04/21/2019 0917   CHOLHDL 3.8 04/21/2019 0917   LDLCALC 66 04/21/2019 0917    Physical Exam:    VS:  BP 128/60 (BP Location: Left Arm, Patient Position: Sitting, Cuff Size: Normal)   Pulse 77   Ht 5\' 1"  (1.549 m)   Wt 162 lb (73.5 kg)   SpO2 97%   BMI 30.61 kg/m     Wt Readings from Last 3 Encounters:  08/25/22 162 lb (73.5 kg)  07/21/22 165 lb 12.8 oz (75.2 kg)  06/25/22 159 lb 9.6 oz (72.4 kg)     GEN:  Well nourished, well developed in no acute distress HEENT: Normal NECK: No JVD; No carotid bruits no JVD while sitting upright LYMPHATICS: No lymphadenopathy CARDIAC: Irregularly irregular, all assist.  Heart murmur with a grade 3/6 best heard at the apex, no rubs, no gallops RESPIRATORY:  Clear to auscultation without rales, wheezing or rhonchi  ABDOMEN: Soft, non-tender, non-distended MUSCULOSKELETAL:  No edema; No deformity  SKIN: Warm and dry LOWER EXTREMITIES: no swelling NEUROLOGIC:  Alert and oriented x 3 PSYCHIATRIC:  Normal affect   ASSESSMENT:    1. Severe mitral regurgitation   2. Pulmonary HTN (HCC)   3. Nonobstructive atherosclerosis of coronary artery   4. Essential hypertension   5. Permanent  atrial fibrillation (HCC)   6. Shortness of breath    PLAN:    In order of problems listed above:  Moderate to severe mitral regurgitation.  I hope will be able to improve competence of the valve with some increased diuresis.  I will check Chem-7 as well as proBNP today to see if can augment her diuresis.  She will be referred to our structural heart disease team with consideration for mitral valve clip.  I explained to the family that the first step would be to do a transesophageal echocardiogram to better assess the valve and then if it required proceed with clip however overall it is a very complex situation since she already does have pulmonary hypertension. Pulmonary hypertension most likely group 2 related to her severe mitral regurgitation. Cardiomyopathy with ejection fraction 35 to 40%.  Previously 55%.  Difficulty tolerating medications because of kidney dysfunction.  Will try to continue present management. Permanent atrial fibrillation.  Rate controlled, she is anticoagulant Xarelto which I will continue. Dyspnea on exertion much improved after hospitalization however she lost only 5 pounds.   Medication Adjustments/Labs and Tests Ordered: Current medicines are reviewed at length with the patient today.  Concerns regarding medicines are outlined above.  No orders of the defined types were placed in this encounter.  Medication changes: No orders of the defined types were placed in this encounter.   Signed, 13/08/23, MD, [facility] 08/25/2022 10:23 AM    [facility]

## 2022-08-25 NOTE — Addendum Note (Signed)
Addended by: Jacobo Forest D on: 08/25/2022 10:40 AM   Modules accepted: Orders

## 2022-08-25 NOTE — Telephone Encounter (Signed)
Per Dr. Agustin Cree, attempted to schedule the patient for Structural Heart consult with Dr. Burt Knack again. She declined to schedule at this time, stating to call back later in the week.

## 2022-08-25 NOTE — Patient Instructions (Signed)
Medication Instructions:  Your physician recommends that you continue on your current medications as directed. Please refer to the Current Medication list given to you today.  *If you need a refill on your cardiac medications before your next appointment, please call your pharmacy*   Lab Work: CMP, ProBNP_ Today If you have labs (blood work) drawn today and your tests are completely normal, you will receive your results only by: [place] (if you have MyChart) OR A paper copy in the mail If you have any lab test that is abnormal or we need to change your treatment, we will call you to review the results.   Testing/Procedures: None Ordered   Follow-Up: At [facility], you and your health needs are our priority.  As part of our continuing mission to provide you with exceptional heart care, we have created designated Provider Care Teams.  These Care Teams include your primary Cardiologist (physician) and Advanced Practice Providers (APPs -  Physician Assistants and Nurse Practitioners) who all work together to provide you with the care you need, when you need it.  We recommend signing up for the patient portal called "MyChart".  Sign up information is provided on this After Visit Summary.  MyChart is used to connect with patients for Virtual Visits (Telemedicine).  Patients are able to view lab/test results, encounter notes, upcoming appointments, etc.  Non-urgent messages can be sent to your provider as well.   To learn more about what you can do with MyChart, go to [email or web address].    Your next appointment:   1 month(s)  The format for your next appointment:   In Person  Provider:   Jenne Campus, MD    Other Instructions Referral to structural disease program- They will call for appt

## 2022-08-26 LAB — COMPREHENSIVE METABOLIC PANEL
ALT: 12 IU/L (ref 0–32)
AST: 10 IU/L (ref 0–40)
Albumin/Globulin Ratio: 1.4 (ref 1.2–2.2)
Albumin: 3.7 g/dL (ref 3.7–4.7)
Alkaline Phosphatase: 79 IU/L (ref 44–121)
BUN/Creatinine Ratio: 22 (ref 12–28)
BUN: 35 mg/dL — ABNORMAL HIGH (ref 8–27)
Bilirubin Total: 0.4 mg/dL (ref 0.0–1.2)
CO2: 24 mmol/L (ref 20–29)
Calcium: 10 mg/dL (ref 8.7–10.3)
Chloride: 103 mmol/L (ref 96–106)
Creatinine, Ser: 1.61 mg/dL — ABNORMAL HIGH (ref 0.57–1.00)
Globulin, Total: 2.6 g/dL (ref 1.5–4.5)
Glucose: 96 mg/dL (ref 70–99)
Potassium: 3.5 mmol/L (ref 3.5–5.2)
Sodium: 143 mmol/L (ref 134–144)
Total Protein: 6.3 g/dL (ref 6.0–8.5)
eGFR: 31 mL/min/{1.73_m2} — ABNORMAL LOW (ref >59)

## 2022-08-26 LAB — PRO B NATRIURETIC PEPTIDE: NT-Pro BNP: 5911 pg/mL — ABNORMAL HIGH (ref 0–738)

## 2022-08-26 NOTE — Telephone Encounter (Signed)
Spoke with the patient's husband. While several sooner appointments were offered, they scheduled a consult with Dr. Burt Knack on 10/06/2022. He was grateful for call and agreed with plan.

## 2022-08-28 ENCOUNTER — Ambulatory Visit
Admission: RE | Admit: 2022-08-28 | Discharge: 2022-08-28 | Disposition: A | Discharge status: Home / Self Care | Payer: Self-pay | Source: Ambulatory Visit | Attending: Cardiology | Admitting: Cardiology

## 2022-08-28 ENCOUNTER — Other Ambulatory Visit: Payer: Self-pay

## 2022-08-28 DIAGNOSIS — I340 Nonrheumatic mitral (valve) insufficiency: Secondary | ICD-10-CM

## 2022-09-12 ENCOUNTER — Telehealth: Payer: Self-pay

## 2022-09-12 NOTE — Telephone Encounter (Signed)
-----  Message from Park Liter, MD sent at 08/28/2022 12:09 PM EST ----- Labs showed relatively stable values, will continue present management

## 2022-09-12 NOTE — Telephone Encounter (Signed)
Patient notified of results.

## 2022-09-22 ENCOUNTER — Ambulatory Visit: Payer: PPO | Attending: Cardiology | Admitting: Cardiology

## 2022-09-22 VITALS — BP 130/60 | HR 63 | Ht 61.0 in | Wt 164.0 lb

## 2022-09-22 DIAGNOSIS — I428 Other cardiomyopathies: Secondary | ICD-10-CM

## 2022-09-22 DIAGNOSIS — I50814 Right heart failure due to left heart failure: Secondary | ICD-10-CM

## 2022-09-22 DIAGNOSIS — J410 Simple chronic bronchitis: Secondary | ICD-10-CM | POA: Diagnosis not present

## 2022-09-22 DIAGNOSIS — I340 Nonrheumatic mitral (valve) insufficiency: Secondary | ICD-10-CM

## 2022-09-22 NOTE — Progress Notes (Signed)
Cardiology Office Note:    Date:  09/22/2022   ID:  Kerri Rogers, DOB 1935/02/12, MRN [id number]  PCP:  Guadalupe Maple, MD  Cardiologist:  Jenne Campus, MD    Referring MD: Guadalupe Maple, MD   Chief Complaint  Patient presents with   Leg Swelling   Shortness of Breath    History of Present Illness:    Kerri Rogers is a 88 y.o. female  with quite complex past medical history that include cardiomyopathy latest estimation of left ventricle ejection fraction 35 to 40%, moderate to severe mitral regurgitation, she also had a cardiac catheterization done at the beginning of December 2023 which showed 25% of left main, 40% proximal LAD 30% mid 30% distal LAD, 50% circumflex, 30% RCA, right side cardiac catheterization show cardiac output 2.59 cardiac index 1.5, pulm artery pressure was 61/33 with mean pressure of 44 mmHg. She had started having conversation about potentially mitral valve clip however they wanted to talk to me and find my opinion about potential procedure. She was admitted actually for congestive heart failure. She was diuresed however lost only 5 pounds. Additional problem include kidney dysfunction with creatinine neighborhood of 1.6., However last Chem-7 I have is from 13 December with GFR for 48 and creatinine 1.13.  She comes today to my office for follow-up.  Since have seen her last time she gained few pounds.  Shortness of breath is worse, also she does have worsening of swelling of lower extremities.  Her husband who is with her start wearing she goes to kitchen table have to sit and catch her breath before he eats.  She is even short of breath while walking to the room done to recover session she gets better.  She denies have any chest pain tightness squeezing pressure burning chest.  She had difficulty laying flat at night.  Has husband giving her Xanax to help with that but obviously we understand that this is not the way to go.  Past Medical History:  Diagnosis Date    Asthma with bronchitis and status asthmaticus    Atrial fibrillation (HCC)    Atrial flutter, paroxysmal ([place]) 03/01/2015   B12 deficiency    Bladder spasm    CHF (congestive heart failure) (HCC)    Chronic constipation    COPD (chronic obstructive pulmonary disease) (HCC)    Edema    Essential hypertension 03/01/2015   GERD (gastroesophageal reflux disease)    Gout    Herniated lumbar intervertebral disc    History of pulmonary embolism 03/01/2015   Hypertension    Insomnia    Long term (current) use of anticoagulants    Overactive bladder    Peripheral autonomic neuropathy    Pre-ulcerative calluses    Shortness of breath 06/18/2022   Skin rash    Stasis dermatitis    Ventral hernia    Vitamin D deficiency     Past Surgical History:  Procedure Laterality Date   ABDOMINAL HYSTERECTOMY     BLADDER SURGERY     CATARACT EXTRACTION     REPLACEMENT TOTAL KNEE BILATERAL     TONSILLECTOMY      Current Medications: Current Meds  Medication Sig   acetaminophen (TYLENOL) 500 MG tablet Take 1,000 mg by mouth 2 (two) times daily as needed for mild pain or moderate pain.   albuterol (VENTOLIN HFA) 108 (90 Base) MCG/ACT inhaler Inhale 1-2 puffs into the lungs every 4 (four) hours as needed for shortness of breath or wheezing.   benzonatate (  TESSALON) 100 MG capsule Take 100 mg by mouth 3 (three) times daily as needed for cough.   dapagliflozin propanediol (FARXIGA) 5 MG TABS tablet Take 5 mg by mouth daily.   digoxin (LANOXIN) 0.125 MG tablet Take 0.0625 mg by mouth daily.   diphenhydramine-acetaminophen (TYLENOL PM) 25-500 MG TABS tablet Take 2 tablets by mouth at bedtime as needed (sleep).   ferrous gluconate (FERGON) 324 MG tablet Take 324 mg by mouth daily with breakfast.   ferrous sulfate 324 (65 Fe) MG TBEC Take 324 mg by mouth daily.    fluticasone (FLONASE) 50 MCG/ACT nasal spray Place 2 sprays into both nostrils daily.   hydrOXYzine (ATARAX) 25 MG tablet Take 1 tablet (25  mg total) by mouth every 6 (six) hours as needed for anxiety or itching.   latanoprost (XALATAN) 0.005 % ophthalmic solution Place 1 drop into both eyes at bedtime.   magnesium oxide (MAG-OX) 400 MG tablet Take 400 mg by mouth daily.   MAGNESIUM PO Take 250 mg by mouth daily.    melatonin 5 MG TABS Take 5 mg by mouth at bedtime.   metoprolol tartrate (LOPRESSOR) 25 MG tablet Take 25 mg by mouth 2 (two) times daily.   montelukast (SINGULAIR) 10 MG tablet Take 10 mg by mouth daily.   Multiple Vitamin (MULTIVITAMIN) tablet Take 1 tablet by mouth daily.   nitrofurantoin, macrocrystal-monohydrate, (MACROBID) 100 MG capsule Take 100 mg by mouth 2 (two) times daily.   nitroGLYCERIN (NITROSTAT) 0.4 MG SL tablet Place 0.4 mg under the tongue every 5 (five) minutes as needed for chest pain.   ondansetron (ZOFRAN) 4 MG tablet Take 4 mg by mouth every 8 (eight) hours as needed for nausea or vomiting.   oxybutynin (DITROPAN-XL) 10 MG 24 hr tablet Take 10 mg by mouth daily.   polyethylene glycol powder (GOODSENSE CLEARLAX) 17 GM/SCOOP powder Take 17 g by mouth daily.   torsemide (DEMADEX) 20 MG tablet Take 40 mg by mouth daily. 40mg  in the AM , 20mg  in the PM   XARELTO 15 MG TABS tablet TAKE 1 TABLET BY MOUTH ONCE DAILY WITH SUPPER (Patient taking differently: Take 15 mg by mouth daily with supper.)     Allergies:   Flagyl [metronidazole], Penicillins, and Sulfamethoxazole-trimethoprim   Social History   Socioeconomic History   Marital status: Married    Spouse name: Not on file   Number of children: Not on file   Years of education: Not on file   Highest education level: Not on file  Occupational History   Not on file  Tobacco Use   Smoking status: Never   Smokeless tobacco: Never  Vaping Use   Vaping Use: Never used  Substance and Sexual Activity   Alcohol use: No   Drug use: No   Sexual activity: Not on file  Other Topics Concern   Not on file  Social History Narrative   Not on file    Social Determinants of Health   Financial Resource Strain: Not on file  Food Insecurity: Not on file  Transportation Needs: Not on file  Physical Activity: Not on file  Stress: Not on file  Social Connections: Not on file     Family History: The patient's family history includes Anxiety disorder in her sister; Asthma in her sister; Atrial fibrillation in her sister; Diabetes in her mother; GER disease in her sister; Heart failure in her mother; Hypertension in her brother. There is no history of Thyroid disease. ROS:   Please  see the history of present illness.    All 14 point review of systems negative except as described per history of present illness  EKGs/Labs/Other Studies Reviewed:      Recent Labs: 11/27/2021: Hemoglobin 13.6; Platelets 259 08/25/2022: ALT 12; BUN 35; Creatinine, Ser 1.61; NT-Pro BNP 5,911; Potassium 3.5; Sodium 143  Recent Lipid Panel    Component Value Date/Time   CHOL 127 04/21/2019 0917   TRIG 164 (H) 04/21/2019 0917   HDL 33 (L) 04/21/2019 0917   CHOLHDL 3.8 04/21/2019 0917   LDLCALC 66 04/21/2019 0917    Physical Exam:    VS:  BP 130/60 (BP Location: Left Arm, Patient Position: Sitting)   Pulse 63   Ht 5\' 1"  (1.549 m)   Wt 164 lb (74.4 kg)   SpO2 93%   BMI 30.99 kg/m     Wt Readings from Last 3 Encounters:  09/22/22 164 lb (74.4 kg)  08/25/22 162 lb (73.5 kg)  07/21/22 165 lb 12.8 oz (75.2 kg)     GEN:  Well nourished, well developed in no acute distress HEENT: Normal NECK: No JVD; No carotid bruits LYMPHATICS: No lymphadenopathy CARDIAC: RRR, holosystolic murmur grade 2/6 best heard left border sternum, no rubs, no gallops RESPIRATORY:  Clear to auscultation without rales, wheezing or rhonchi  ABDOMEN: Soft, non-tender, non-distended MUSCULOSKELETAL:  No edema; No deformity  SKIN: Warm and dry LOWER EXTREMITIES: 3+ swelling NEUROLOGIC:  Alert and oriented x 3 PSYCHIATRIC:  Normal affect   ASSESSMENT:    1. Right-sided  congestive heart failure secondary to left-sided congestive heart failure ([place])   2. Nonischemic cardiomyopathy ([place])   3. Severe mitral regurgitation   4. Simple chronic bronchitis (HCC)    PLAN:    In order of problems listed above:  Congestive heart failure clearly decompensated.  She does have diminished left ventricle ejection fraction on top of that severe mitral regurgitation.  I am trying to manage her as an outpatient but we obviously failing.  I think she can benefit from hospitalization and aggressive IV diuresis, she may very required IV continuous furosemide drip.  The obstacle is the fact that she has kidney dysfunction with usual creatinine at the level of 1.6.  Luckily her blood pressure is quite good so we may use vasodilatation.  She is taking digoxin we need to check digoxin level make sure she is not toxic on that.  She looks somewhat jaundiced on my physical exam. Nonischemic cardiomyopathy plan as described above. Severe mitral regurgitation, she does have appointment as an outpatient with Dr. Burt Knack on 19 February to discuss potential options of mitral valve clip.  However now she is decompensated she is to be brought to the hospital to improve her congestive heart failure status.  COPD which is known.  Contributing to her pathology however I think majority of the problem right now is facing his mitral valve regurgitation with pulmonary hypertension and cardiomyopathy. I offer him hospitalization at [facility] but he preferred to go to [facility]  I was chaperoned by my nurse's assistant Jerl Santos during entire visit   Medication Adjustments/Labs and Tests Ordered: Current medicines are reviewed at length with the patient today.  Concerns regarding medicines are outlined above.  No orders of the defined types were placed in this encounter.  Medication changes: No orders of the defined types were placed in this encounter.   Signed, Park Liter, MD,  [facility] 09/22/2022 3:39 PM    [place]

## 2022-10-02 ENCOUNTER — Ambulatory Visit: Payer: PPO | Attending: Cardiology | Admitting: Cardiology

## 2022-10-02 ENCOUNTER — Encounter: Payer: Self-pay | Admitting: Cardiology

## 2022-10-02 VITALS — BP 116/56 | HR 76 | Wt 152.6 lb

## 2022-10-02 DIAGNOSIS — R0602 Shortness of breath: Secondary | ICD-10-CM | POA: Diagnosis not present

## 2022-10-02 DIAGNOSIS — I428 Other cardiomyopathies: Secondary | ICD-10-CM

## 2022-10-02 DIAGNOSIS — I50814 Right heart failure due to left heart failure: Secondary | ICD-10-CM | POA: Diagnosis not present

## 2022-10-02 DIAGNOSIS — I272 Pulmonary hypertension, unspecified: Secondary | ICD-10-CM | POA: Diagnosis not present

## 2022-10-02 NOTE — Patient Instructions (Signed)
Medication Instructions:  The current medical regimen is effective;  continue present plan and medications.  *If you need a refill on your cardiac medications before your next appointment, please call your pharmacy*   Lab Work: None  Testing/Procedures: None   Follow-Up: At [facility], you and your health needs are our priority.  As part of our continuing mission to provide you with exceptional heart care, we have created designated Provider Care Teams.  These Care Teams include your primary Cardiologist (physician) and Advanced Practice Providers (APPs -  Physician Assistants and Nurse Practitioners) who all work together to provide you with the care you need, when you need it.  We recommend signing up for the patient portal called "MyChart".  Sign up information is provided on this After Visit Summary.  MyChart is used to connect with patients for Virtual Visits (Telemedicine).  Patients are able to view lab/test results, encounter notes, upcoming appointments, etc.  Non-urgent messages can be sent to your provider as well.   To learn more about what you can do with MyChart, go to [email or web address].    Your next appointment:   1 month(s)  Provider:   Jenne Campus, MD    Other Instructions None

## 2022-10-02 NOTE — Progress Notes (Signed)
Cardiology Office Note:    Date:  10/02/2022   ID:  Kerri Rogers, DOB [birth year], MRN [id number]  PCP:  Guadalupe Maple, MD  Cardiologist:  Jenne Campus, MD    Referring MD: Guadalupe Maple, MD   No chief complaint on file.   History of Present Illness:    Kerri Rogers is a 88 y.o. female  with quite complex past medical history that include cardiomyopathy latest estimation of left ventricle ejection fraction 35 to 40%, moderate to severe mitral regurgitation, she also had a cardiac catheterization done at the beginning of December 2023 which showed 25% of left main, 40% proximal LAD 30% mid 30% distal LAD, 50% circumflex, 30% RCA, right side cardiac catheterization show cardiac output 2.59 cardiac index 1.5, pulm artery pressure was 61/33 with mean pressure of 44 mmHg. She had started having conversation about potentially mitral valve clip however they wanted to talk to me and find my opinion about potential procedure. She was admitted actually for congestive heart failure. She was diuresed however lost only 5 pounds. Additional problem include kidney dysfunction with creatinine neighborhood of 1.6., However last Chem-7 I have is from 13 December with GFR for 48 and creatinine 1.13.   Last time I seen her which was at the beginning of February she was severely short of breath she could not do anything without getting short of breath she was admitted to [facility] she was diuresed about 10 pounds and she is feeling much better.  She is able to walk a little bit with a walker can sleep well swelling of lower extremity still there but only mild.  Last creatinine while in the hospital was 1.3.  Past Medical History:  Diagnosis Date   Asthma with bronchitis and status asthmaticus    Atrial fibrillation (HCC)    Atrial flutter, paroxysmal ([place]) 03/01/2015   B12 deficiency    Bladder spasm    CHF (congestive heart failure) (HCC)    Chronic constipation    COPD (chronic obstructive  pulmonary disease) (HCC)    Edema    Essential hypertension 03/01/2015   GERD (gastroesophageal reflux disease)    Gout    Herniated lumbar intervertebral disc    History of pulmonary embolism 03/01/2015   Hypertension    Insomnia    Long term (current) use of anticoagulants    Overactive bladder    Peripheral autonomic neuropathy    Pre-ulcerative calluses    Shortness of breath 06/18/2022   Skin rash    Stasis dermatitis    Ventral hernia    Vitamin D deficiency     Past Surgical History:  Procedure Laterality Date   ABDOMINAL HYSTERECTOMY     BLADDER SURGERY     CATARACT EXTRACTION     REPLACEMENT TOTAL KNEE BILATERAL     TONSILLECTOMY      Current Medications: No outpatient medications have been marked as taking for the 10/02/22 encounter (Office Visit) with Park Liter, MD.     Allergies:   Flagyl [metronidazole], Penicillins, and Sulfamethoxazole-trimethoprim   Social History   Socioeconomic History   Marital status: Married    Spouse name: Not on file   Number of children: Not on file   Years of education: Not on file   Highest education level: Not on file  Occupational History   Not on file  Tobacco Use   Smoking status: Never   Smokeless tobacco: Never  Vaping Use   Vaping Use: Never used  Substance and Sexual Activity  Alcohol use: No   Drug use: No   Sexual activity: Not on file  Other Topics Concern   Not on file  Social History Narrative   Not on file   Social Determinants of Health   Financial Resource Strain: Not on file  Food Insecurity: Not on file  Transportation Needs: Not on file  Physical Activity: Not on file  Stress: Not on file  Social Connections: Not on file     Family History: The patient's family history includes Anxiety disorder in her sister; Asthma in her sister; Atrial fibrillation in her sister; Diabetes in her mother; GER disease in her sister; Heart failure in her mother; Hypertension in her brother.  There is no history of Thyroid disease. ROS:   Please see the history of present illness.    All 14 point review of systems negative except as described per history of present illness  EKGs/Labs/Other Studies Reviewed:      Recent Labs: 11/27/2021: Hemoglobin 13.6; Platelets 259 08/25/2022: ALT 12; BUN 35; Creatinine, Ser 1.61; NT-Pro BNP 5,911; Potassium 3.5; Sodium 143  Recent Lipid Panel    Component Value Date/Time   CHOL 127 04/21/2019 0917   TRIG 164 (H) 04/21/2019 0917   HDL 33 (L) 04/21/2019 0917   CHOLHDL 3.8 04/21/2019 0917   LDLCALC 66 04/21/2019 0917    Physical Exam:    VS:  BP (!) 116/56   Pulse 76   Wt 152 lb 9.6 oz (69.2 kg)   SpO2 96%   BMI 28.83 kg/m     Wt Readings from Last 3 Encounters:  10/02/22 152 lb 9.6 oz (69.2 kg)  09/22/22 164 lb (74.4 kg)  08/25/22 162 lb (73.5 kg)     GEN:  Well nourished, well developed in no acute distress HEENT: Normal NECK: No JVD; No carotid bruits LYMPHATICS: No lymphadenopathy CARDIAC: Irregularly irregular, systolic murmur grade 2/6 best heard left border sternum, no rubs, no gallops RESPIRATORY:  Clear to auscultation without rales, wheezing or rhonchi  ABDOMEN: Soft, non-tender, non-distended MUSCULOSKELETAL:  No edema; No deformity  SKIN: Warm and dry LOWER EXTREMITIES: no swelling NEUROLOGIC:  Alert and oriented x 3 PSYCHIATRIC:  Normal affect   ASSESSMENT:    1. Right-sided congestive heart failure secondary to left-sided congestive heart failure ([place])   2. Pulmonary HTN ([place])   3. Nonischemic cardiomyopathy ([place])   4. Shortness of breath    PLAN:    In order of problems listed above:  Congestive heart failure.  Last echocardiogram I reviewed from hospital which was done on 02 October 2022 showing ejection fraction 50 to 55%, there is moderate to severe mitral regurgitation, her pulmonary pressure at that time was assessed to be 37 mmHg.  She was diuresed in the hospital comes today for follow-up  looks much better and actually feels better.  I will continue present medications, obstacle to care is her kidney dysfunction as well as blood pressure being slightly on the softer side but she is pretty decent medical management.  Sadly, she cannot be put on ACE inhibitor, ARB, Entresto because of kidney dysfunction. Mitral regurgitation which is moderate to severe.  I am trying to diurese.  The best I can to see if there would be improvement of mitral regurgitation however she does have already appointment with my colleague Dr. Burt Knack to discuss potential mitral valve clip Atrial fibrillation which is chronic, continue anticoagulation I does appropriate to her kidney dysfunction   Medication Adjustments/Labs and Tests Ordered: Current medicines  are reviewed at length with the patient today.  Concerns regarding medicines are outlined above.  No orders of the defined types were placed in this encounter.  Medication changes: No orders of the defined types were placed in this encounter.   Signed, Park Liter, MD, [facility] 10/02/2022 2:52 PM    [place]

## 2022-10-06 ENCOUNTER — Institutional Professional Consult (permissible substitution): Payer: PPO | Admitting: Cardiovascular Disease

## 2022-10-09 ENCOUNTER — Ambulatory Visit: Payer: PPO | Attending: Cardiovascular Disease | Admitting: Cardiovascular Disease

## 2022-10-09 ENCOUNTER — Encounter: Payer: Self-pay | Admitting: Cardiovascular Disease

## 2022-10-09 VITALS — BP 132/82 | HR 60 | Ht 61.0 in | Wt 152.4 lb

## 2022-10-09 DIAGNOSIS — Z01810 Encounter for preprocedural cardiovascular examination: Secondary | ICD-10-CM | POA: Diagnosis not present

## 2022-10-09 DIAGNOSIS — N183 Chronic kidney disease, stage 3 unspecified: Secondary | ICD-10-CM | POA: Diagnosis not present

## 2022-10-09 DIAGNOSIS — I340 Nonrheumatic mitral (valve) insufficiency: Secondary | ICD-10-CM | POA: Diagnosis not present

## 2022-10-09 NOTE — Patient Instructions (Signed)
Medication Instructions:  Your physician recommends that you continue on your current medications as directed. Please refer to the Current Medication list given to you today.  *If you need a refill on your cardiac medications before your next appointment, please call your pharmacy*  Lab Work: CBC, BMET (within one week of procedure) If you have labs (blood work) drawn today and your tests are completely normal, you will receive your results only by: [place] (if you have MyChart) OR A paper copy in the mail If you have any lab test that is abnormal or we need to change your treatment, we will call you to review the results.  Testing/Procedures: Transesophageal Echocardiogram Your physician has requested that you have a TEE. During a TEE, sound waves are used to create images of your heart. It provides your doctor with information about the size and shape of your heart and how well your heart's chambers and valves are working. In this test, a transducer is attached to the end of a flexible tube that's guided down your throat and into your esophagus (the tube leading from you mouth to your stomach) to get a more detailed image of your heart. You are not awake for the procedure. Please see the instruction sheet given to you today. For further information please visit [email or web address].  Follow-Up: At [facility], you and your health needs are our priority.  As part of our continuing mission to provide you with exceptional heart care, we have created designated Provider Care Teams.  These Care Teams include your primary Cardiologist (physician) and Advanced Practice Providers (APPs -  Physician Assistants and Nurse Practitioners) who all work together to provide you with the care you need, when you need it.  We recommend signing up for the patient portal called "MyChart".  Sign up information is provided on this After Visit Summary.  MyChart is used to connect with patients for  Virtual Visits (Telemedicine).  Patients are able to view lab/test results, encounter notes, upcoming appointments, etc.  Non-urgent messages can be sent to your provider as well.   To learn more about what you can do with MyChart, go to [email or web address].    Your next appointment:   Structural Team will follow-up  Provider:   Sherren Mocha, MD     Other Instructions     Dear Kerri Rogers  You are scheduled for a TEE (Transesophageal Echocardiogram) on Tuesday, March 12 with Dr. Johnsie Cancel.  Please arrive at the [facility] (Main Entrance A) at [facility]: [address] [place], [place] [address] at 7:30 AM.    DIET:  Nothing to eat or drink after midnight except a sip of water with medications (see medication instructions below)  MEDICATION INSTRUCTIONS:  DO NOT TAKE Furosemide the morning of procedure Continue taking your anticoagulant (blood thinner): Rivaroxaban (Xarelto).  You will need to continue this after your procedure until you are told by your provider that it is safe to stop.    LABS: CBC, BMET (within 7 days of procedure)  FYI:  For your safety, and to allow [place] to monitor your vital signs accurately during the surgery/procedure we request: If you have artificial nails, gel coating, SNS etc, please have those removed prior to your surgery/procedure. Not having the nail coverings /polish removed may result in cancellation or delay of your surgery/procedure.  You must have a responsible person to drive you home and stay in the waiting area during your procedure. Failure to do so could result  in cancellation.  Bring your insurance cards.  *Special Note: Every effort is made to have your procedure done on time. Occasionally there are emergencies that occur at the hospital that may cause delays. Please be patient if a delay does occur.

## 2022-10-09 NOTE — Progress Notes (Signed)
Cardiology Office Note:    Date:  10/10/2022   ID:  Kerri Rogers, DOB 1934-08-31, MRN [id number]  PCP:  Guadalupe Maple, MD   [place] Providers Cardiologist:  None     Referring MD: Guadalupe Maple, MD   Chief Complaint  Patient presents with   Shortness of Breath    History of Present Illness:    Kerri Rogers is a 88 y.o. female presenting for evaluation of severe mitral regurgitation.  The patient is here with her husband today.  They live independently in [place], [place].  The patient is not very active anymore, but continues to perform her activities of daily living.  She is most limited by poor balance that she attributes to neuropathy.  The patient had an echocardiogram in November 2023 demonstrating normal LV systolic function with LVEF 55 to 60%, normal RV size and function, severe pulmonary hypertension, and moderately severe mitral regurgitation with no evidence of mitral stenosis.  She followed up with Dr. Geraldo Pitter who referred her for structural heart consultation.  However, just a few days after that visit, she was hospitalized with decompensated heart failure.  Her hospitalization was in [facility].  She underwent echo and cardiac catheterization assessment.  Heart catheterization demonstrated elevated right and left heart filling pressures with no evidence of obstructive coronary artery disease.  Her echocardiogram demonstrated moderate to severe mitral regurgitation, but she was advised that she would not likely be a candidate for transcatheter mitral valve therapies.  The patient presents today for further evaluation of her mitral regurgitation.  Since she was hospitalized and diuresed, she feels much better.  Her weight is down over 10 pounds.  She continues to weigh on a scale at home every day.  Her breathing has improved significantly.  She is now able to walk at a normal pace without shortness of breath.  She denies orthopnea or PND.  She still has some  leg edema but this is improved.  She denies chest pain or pressure, lightheadedness, or syncope.  Patient ambulates with a walker.  She has not had any falls in the past year.  Past Medical History:  Diagnosis Date   Asthma with bronchitis and status asthmaticus    Atrial fibrillation (HCC)    Atrial flutter, paroxysmal ([place]) 03/01/2015   B12 deficiency    Bladder spasm    CHF (congestive heart failure) (HCC)    Chronic constipation    COPD (chronic obstructive pulmonary disease) (HCC)    Edema    Essential hypertension 03/01/2015   GERD (gastroesophageal reflux disease)    Gout    Herniated lumbar intervertebral disc    History of pulmonary embolism 03/01/2015   Hypertension    Insomnia    Long term (current) use of anticoagulants    Overactive bladder    Peripheral autonomic neuropathy    Pre-ulcerative calluses    Shortness of breath 06/18/2022   Skin rash    Stasis dermatitis    Ventral hernia    Vitamin D deficiency     Past Surgical History:  Procedure Laterality Date   ABDOMINAL HYSTERECTOMY     BLADDER SURGERY     CATARACT EXTRACTION     REPLACEMENT TOTAL KNEE BILATERAL     TONSILLECTOMY      Current Medications: Current Meds  Medication Sig   acetaminophen (TYLENOL) 500 MG tablet Take 1,000 mg by mouth 2 (two) times daily as needed for mild pain or moderate pain.   albuterol (VENTOLIN HFA) 108 (  90 Base) MCG/ACT inhaler Inhale 1-2 puffs into the lungs every 4 (four) hours as needed for shortness of breath or wheezing.   dapagliflozin propanediol (FARXIGA) 5 MG TABS tablet Take 5 mg by mouth daily.   digoxin (LANOXIN) 0.125 MG tablet Take 0.0625 mg by mouth daily.   diphenhydramine-acetaminophen (TYLENOL PM) 25-500 MG TABS tablet Take 2 tablets by mouth at bedtime as needed (sleep).   ferrous gluconate (FERGON) 324 MG tablet Take 324 mg by mouth daily with breakfast.   furosemide (LASIX) 40 MG tablet Take 60 mg by mouth 2 (two) times daily.   hydrOXYzine  (ATARAX) 25 MG tablet Take 1 tablet (25 mg total) by mouth every 6 (six) hours as needed for anxiety or itching.   latanoprost (XALATAN) 0.005 % ophthalmic solution Place 1 drop into both eyes at bedtime.   magnesium oxide (MAG-OX) 400 MG tablet Take 400 mg by mouth daily.   melatonin 5 MG TABS Take 5 mg by mouth at bedtime.   metoprolol tartrate (LOPRESSOR) 25 MG tablet Take 25 mg by mouth 2 (two) times daily.   montelukast (SINGULAIR) 10 MG tablet Take 10 mg by mouth daily.   Multiple Vitamin (MULTIVITAMIN) tablet Take 1 tablet by mouth daily.   nitrofurantoin, macrocrystal-monohydrate, (MACROBID) 100 MG capsule Take 100 mg by mouth 2 (two) times daily.   nitroGLYCERIN (NITROSTAT) 0.4 MG SL tablet Place 0.4 mg under the tongue every 5 (five) minutes as needed for chest pain.   ondansetron (ZOFRAN) 4 MG tablet Take 4 mg by mouth every 8 (eight) hours as needed for nausea or vomiting.   oxybutynin (DITROPAN-XL) 10 MG 24 hr tablet Take 10 mg by mouth daily.   polyethylene glycol powder (GOODSENSE CLEARLAX) 17 GM/SCOOP powder Take 17 g by mouth daily.   potassium chloride SA (KLOR-CON M) 20 MEQ tablet Take 20 mEq by mouth every Monday, Wednesday, and Friday.   XARELTO 15 MG TABS tablet TAKE 1 TABLET BY MOUTH ONCE DAILY WITH SUPPER (Patient taking differently: Take 15 mg by mouth daily with supper.)     Allergies:   Flagyl [metronidazole], Penicillins, and Sulfamethoxazole-trimethoprim   Social History   Socioeconomic History   Marital status: Married    Spouse name: Not on file   Number of children: Not on file   Years of education: Not on file   Highest education level: Not on file  Occupational History   Not on file  Tobacco Use   Smoking status: Never   Smokeless tobacco: Never  Vaping Use   Vaping Use: Never used  Substance and Sexual Activity   Alcohol use: No   Drug use: No   Sexual activity: Not on file  Other Topics Concern   Not on file  Social History Narrative   Not  on file   Social Determinants of Health   Financial Resource Strain: Not on file  Food Insecurity: Not on file  Transportation Needs: Not on file  Physical Activity: Not on file  Stress: Not on file  Social Connections: Not on file     Family History: The patient's family history includes Anxiety disorder in her sister; Asthma in her sister; Atrial fibrillation in her sister; Diabetes in her mother; GER disease in her sister; Heart failure in her mother; Hypertension in her brother. There is no history of Thyroid disease.  ROS:   Please see the history of present illness.    All other systems reviewed and are negative.  EKGs/Labs/Other Studies Reviewed:  The following studies were reviewed today: Echo 07/08/2022: 1. Left ventricular ejection fraction, by estimation, is 55 to 60%. The  left ventricle has normal function. The left ventricle has no regional  wall motion abnormalities. There is mild left ventricular hypertrophy.  Left ventricular diastolic parameters  are indeterminate.   2. Right ventricular systolic function is normal. The right ventricular  size is normal. There is severely elevated pulmonary artery systolic  pressure.   3. Left atrial size was severely dilated.   4. Right atrial size was moderately dilated.   5. The mitral valve is normal in structure. Moderate to severe mitral  valve regurgitation. No evidence of mitral stenosis. Moderate mitral  annular calcification.   6. Tricuspid valve regurgitation is moderate.   7. The aortic valve is normal in structure. Aortic valve regurgitation is  mild. No aortic stenosis is present.   8. The inferior vena cava is normal in size with greater than 50%  respiratory variability, suggesting right atrial pressure of 3 mmHg.   Cardiac Cath 07/28/2022: Coronary Angiography Findings:  --LM: Ostial 25%  --LAD: Prox 40%, mid 30%, followed by 30%  --LCx: Distal diffusely diseased 50%  --RCA: Mid diffusely diseased  30%  --AV crossed: No   RHC Data: As below  Procedural Statistics   Pressures   RA 14 mmHg/16 mmHg/13 mmHg  RV 62 mmHg/9 mmHg/RVEDP: 13 mmHg  PA 61 mmHg/33 mmHg/44 mmHg  PCW 29 mmHg/30 mmHg/29 mmHg  LA  / /   LV  / /   AO 141 mmHg/81 mmHg/113 mmHg  BP 160 mmHg/117 mmHg/135 mmHg   Saturations   IVC Saturation O2-Sat    SVC Saturation O2-Sat 59.6 %  RA Saturation O2-Sat    RV Saturation O2-Sat    PA Saturation O2-Sat 56.7 %  PCW Saturation O2-Sat    AO Saturation O2-Sat 93.6 %   Derived Parameters   PVR    PVR Index    SVR    SVR Index    TD Aortic Flow    Thermal TSVR 47.3  Thermal SVR 41.78  Thermal TPVR 18.36  Thermal PVR 6.22  Thermal TPVR TSVR 0.39  Thermal PVR SVR 0.15  TD Stroke Volume  26.54   Cardiac Output   Fick A-V O2 Diff    Fick O2 Consumption 164.96  Fick Predicted O2 Consumption 164.96  Fick CO 2.59 L/min  Fick CI 1.5  Thermo CO 2.38 L/min  Thermo CI 1.4   Ventricular Data   LV Max DP/DT    LV Max DP/DT/P    RV Max DP/DT    RV Max DP/DT/P     Comments: Cath showed mild-moderate non-obstructive CAD. [place] numbers as  above, but overall elevated right and left sided filling pressures.   NB: Attempted right radial but sheath wire would not pass the elbow,  therefore switched to femoral access. Would avoid right wrist access in  the future.   Hemostasis: Manual pressure for R-internal jugular vein and R-femoral  artery  EBL: <10 cc  Specimens: none  Complications: none   Recent Labs: 11/27/2021: Hemoglobin 13.6; Platelets 259 08/25/2022: ALT 12; BUN 35; Creatinine, Ser 1.61; NT-Pro BNP 5,911; Potassium 3.5; Sodium 143  Recent Lipid Panel    Component Value Date/Time   CHOL 127 04/21/2019 0917   TRIG 164 (H) 04/21/2019 0917   HDL 33 (L) 04/21/2019 0917   CHOLHDL 3.8 04/21/2019 0917   LDLCALC 66 04/21/2019 0917  Physical Exam:    VS:  BP 132/82 (BP Location: Right Arm, Patient Position: Sitting, Cuff Size: Normal)    Pulse 60   Ht '5\' 1"'$  (1.549 m)   Wt 152 lb 6.4 oz (69.1 kg)   SpO2 97%   BMI 28.80 kg/m     Wt Readings from Last 3 Encounters:  10/09/22 152 lb 6.4 oz (69.1 kg)  10/02/22 152 lb 9.6 oz (69.2 kg)  09/22/22 164 lb (74.4 kg)     GEN:  Pleasant elderly woman in no acute distress HEENT: Normal NECK: No JVD; No carotid bruits LYMPHATICS: No lymphadenopathy CARDIAC: RRR, 3/6 holosystolic murmur at the apex RESPIRATORY:  Clear to auscultation without rales, wheezing or rhonchi  ABDOMEN: Soft, non-tender, non-distended MUSCULOSKELETAL:  mild pretibial edema bilaterally; No deformity  SKIN: Warm and dry NEUROLOGIC:  Alert and oriented x 3 PSYCHIATRIC:  Normal affect   ASSESSMENT:    1. Severe mitral regurgitation   2. Pre-procedural cardiovascular examination    PLAN:    In order of problems listed above:  The patient appears to have at least moderately severe mitral regurgitation by echo assessment.  I reviewed her echo images that are available in our system from her November 2023 echo.  She has preserved LV systolic function with no evidence of regional wall motion abnormality.  She has significant annular calcification as well as chordal calcification.  I cannot find any transmitral gradients, but in reviewing her Doppler information it does not appear that she likely has significant mitral stenosis.  I reviewed the natural history of mitral regurgitation with the patient who has symptoms now well compensated of chronic diastolic heart failure [place] HA functional class II.  I think in order to provide definitive guidance on whether she may or may not be a candidate for any transcatheter mitral valve therapy, she should undergo transesophageal echo.  My suspicion is that she will have too much annular and subvalvular calcification to make her a good candidate for edge-to-edge repair.  However, this needs to be better defined by TEE.  I reviewed the risks, indications, and alternatives to TEE  with the patient and her husband today.  They strongly desire the definitive evaluation.  They understand that risks of esophageal injury or significant complication from TEE are low and occur less than 1% of the time.  I will follow-up with them after her TEE is completed.  No changes are made in her medical regimen today.      Shared Decision Making/Informed Consent The risks [esophageal damage, perforation (1:10,000 risk), bleeding, pharyngeal hematoma as well as other potential complications associated with conscious sedation including aspiration, arrhythmia, respiratory failure and death], benefits (treatment guidance and diagnostic support) and alternatives of a transesophageal echocardiogram were discussed in detail with Kerri Rogers and she is willing to proceed.     Medication Adjustments/Labs and Tests Ordered: Current medicines are reviewed at length with the patient today.  Concerns regarding medicines are outlined above.  Orders Placed This Encounter  Procedures   CBC   Basic metabolic panel   EKG [id number]   No orders of the defined types were placed in this encounter.   Patient Instructions  Medication Instructions:  Your physician recommends that you continue on your current medications as directed. Please refer to the Current Medication list given to you today.  *If you need a refill on your cardiac medications before your next appointment, please call your pharmacy*  Lab Work: CBC, BMET (within one week of  procedure) If you have labs (blood work) drawn today and your tests are completely normal, you will receive your results only by: [place] (if you have MyChart) OR A paper copy in the mail If you have any lab test that is abnormal or we need to change your treatment, we will call you to review the results.  Testing/Procedures: Transesophageal Echocardiogram Your physician has requested that you have a TEE. During a TEE, sound waves are used to create images of  your heart. It provides your doctor with information about the size and shape of your heart and how well your heart's chambers and valves are working. In this test, a transducer is attached to the end of a flexible tube that's guided down your throat and into your esophagus (the tube leading from you mouth to your stomach) to get a more detailed image of your heart. You are not awake for the procedure. Please see the instruction sheet given to you today. For further information please visit [email or web address].  Follow-Up: At [facility], you and your health needs are our priority.  As part of our continuing mission to provide you with exceptional heart care, we have created designated Provider Care Teams.  These Care Teams include your primary Cardiologist (physician) and Advanced Practice Providers (APPs -  Physician Assistants and Nurse Practitioners) who all work together to provide you with the care you need, when you need it.  We recommend signing up for the patient portal called "MyChart".  Sign up information is provided on this After Visit Summary.  MyChart is used to connect with patients for Virtual Visits (Telemedicine).  Patients are able to view lab/test results, encounter notes, upcoming appointments, etc.  Non-urgent messages can be sent to your provider as well.   To learn more about what you can do with MyChart, go to [email or web address].    Your next appointment:   Structural Team will follow-up  Provider:   Sherren Mocha, MD     Other Instructions     Dear Edwinna Areola  You are scheduled for a TEE (Transesophageal Echocardiogram) on Tuesday, March 12 with Dr. Johnsie Cancel.  Please arrive at the [facility] (Main Entrance A) at [facility] [place]: [address] [place], [place] [address] at 7:30 AM.    DIET:  Nothing to eat or drink after midnight except a sip of water with medications (see medication instructions below)  MEDICATION INSTRUCTIONS:  DO NOT TAKE  Furosemide the morning of procedure Continue taking your anticoagulant (blood thinner): Rivaroxaban (Xarelto).  You will need to continue this after your procedure until you are told by your provider that it is safe to stop.    LABS: CBC, BMET (within 7 days of procedure)  FYI:  For your safety, and to allow [place] to monitor your vital signs accurately during the surgery/procedure we request: If you have artificial nails, gel coating, SNS etc, please have those removed prior to your surgery/procedure. Not having the nail coverings /polish removed may result in cancellation or delay of your surgery/procedure.  You must have a responsible person to drive you home and stay in the waiting area during your procedure. Failure to do so could result in cancellation.  Bring your insurance cards.  *Special Note: Every effort is made to have your procedure done on time. Occasionally there are emergencies that occur at the hospital that may cause delays. Please be patient if a delay does occur.       Signed, Sherren Mocha, MD  10/10/2022 1:00 PM    [place]

## 2022-10-09 NOTE — H&P (View-Only) (Signed)
Cardiology Office Note:    Date:  10/10/2022   ID:  Kerri Rogers, DOB 10/27/1934, MRN [id number]  PCP:  Baker, Clifton, MD   [facility] Providers Cardiologist:  None     Referring MD: Baker, Clifton, MD   Chief Complaint  Patient presents with   Shortness of Breath    History of Present Illness:    Kerri Rogers is a 87 y.o. female presenting for evaluation of severe mitral regurgitation.  The patient is here with her husband today.  They live independently in [place], [place].  The patient is not very active anymore, but continues to perform her activities of daily living.  She is most limited by poor balance that she attributes to neuropathy.  The patient had an echocardiogram in November 2023 demonstrating normal LV systolic function with LVEF 55 to 60%, normal RV size and function, severe pulmonary hypertension, and moderately severe mitral regurgitation with no evidence of mitral stenosis.  She followed up with Dr. Revankar who referred her for structural heart consultation.  However, just a few days after that visit, she was hospitalized with decompensated heart failure.  Her hospitalization was in [place].  She underwent echo and cardiac catheterization assessment.  Heart catheterization demonstrated elevated right and left heart filling pressures with no evidence of obstructive coronary artery disease.  Her echocardiogram demonstrated moderate to severe mitral regurgitation, but she was advised that she would not likely be a candidate for transcatheter mitral valve therapies.  The patient presents today for further evaluation of her mitral regurgitation.  Since she was hospitalized and diuresed, she feels much better.  Her weight is down over 10 pounds.  She continues to weigh on a scale at home every day.  Her breathing has improved significantly.  She is now able to walk at a normal pace without shortness of breath.  She denies orthopnea or PND.  She still has some  leg edema but this is improved.  She denies chest pain or pressure, lightheadedness, or syncope.  Patient ambulates with a walker.  She has not had any falls in the past year.  Past Medical History:  Diagnosis Date   Asthma with bronchitis and status asthmaticus    Atrial fibrillation (HCC)    Atrial flutter, paroxysmal (HCC) 03/01/2015   B12 deficiency    Bladder spasm    CHF (congestive heart failure) (HCC)    Chronic constipation    COPD (chronic obstructive pulmonary disease) (HCC)    Edema    Essential hypertension 03/01/2015   GERD (gastroesophageal reflux disease)    Gout    Herniated lumbar intervertebral disc    History of pulmonary embolism 03/01/2015   Hypertension    Insomnia    Long term (current) use of anticoagulants    Overactive bladder    Peripheral autonomic neuropathy    Pre-ulcerative calluses    Shortness of breath 06/18/2022   Skin rash    Stasis dermatitis    Ventral hernia    Vitamin D deficiency     Past Surgical History:  Procedure Laterality Date   ABDOMINAL HYSTERECTOMY     BLADDER SURGERY     CATARACT EXTRACTION     REPLACEMENT TOTAL KNEE BILATERAL     TONSILLECTOMY      Current Medications: Current Meds  Medication Sig   acetaminophen (TYLENOL) 500 MG tablet Take 1,000 mg by mouth 2 (two) times daily as needed for mild pain or moderate pain.   albuterol (VENTOLIN HFA) 108 (  90 Base) MCG/ACT inhaler Inhale 1-2 puffs into the lungs every 4 (four) hours as needed for shortness of breath or wheezing.   dapagliflozin propanediol (FARXIGA) 5 MG TABS tablet Take 5 mg by mouth daily.   digoxin (LANOXIN) 0.125 MG tablet Take 0.0625 mg by mouth daily.   diphenhydramine-acetaminophen (TYLENOL PM) 25-500 MG TABS tablet Take 2 tablets by mouth at bedtime as needed (sleep).   ferrous gluconate (FERGON) 324 MG tablet Take 324 mg by mouth daily with breakfast.   furosemide (LASIX) 40 MG tablet Take 60 mg by mouth 2 (two) times daily.   hydrOXYzine  (ATARAX) 25 MG tablet Take 1 tablet (25 mg total) by mouth every 6 (six) hours as needed for anxiety or itching.   latanoprost (XALATAN) 0.005 % ophthalmic solution Place 1 drop into both eyes at bedtime.   magnesium oxide (MAG-OX) 400 MG tablet Take 400 mg by mouth daily.   melatonin 5 MG TABS Take 5 mg by mouth at bedtime.   metoprolol tartrate (LOPRESSOR) 25 MG tablet Take 25 mg by mouth 2 (two) times daily.   montelukast (SINGULAIR) 10 MG tablet Take 10 mg by mouth daily.   Multiple Vitamin (MULTIVITAMIN) tablet Take 1 tablet by mouth daily.   nitrofurantoin, macrocrystal-monohydrate, (MACROBID) 100 MG capsule Take 100 mg by mouth 2 (two) times daily.   nitroGLYCERIN (NITROSTAT) 0.4 MG SL tablet Place 0.4 mg under the tongue every 5 (five) minutes as needed for chest pain.   ondansetron (ZOFRAN) 4 MG tablet Take 4 mg by mouth every 8 (eight) hours as needed for nausea or vomiting.   oxybutynin (DITROPAN-XL) 10 MG 24 hr tablet Take 10 mg by mouth daily.   polyethylene glycol powder (GOODSENSE CLEARLAX) 17 GM/SCOOP powder Take 17 g by mouth daily.   potassium chloride SA (KLOR-CON M) 20 MEQ tablet Take 20 mEq by mouth every Monday, Wednesday, and Friday.   XARELTO 15 MG TABS tablet TAKE 1 TABLET BY MOUTH ONCE DAILY WITH SUPPER (Patient taking differently: Take 15 mg by mouth daily with supper.)     Allergies:   Flagyl [metronidazole], Penicillins, and Sulfamethoxazole-trimethoprim   Social History   Socioeconomic History   Marital status: Married    Spouse name: Not on file   Number of children: Not on file   Years of education: Not on file   Highest education level: Not on file  Occupational History   Not on file  Tobacco Use   Smoking status: Never   Smokeless tobacco: Never  Vaping Use   Vaping Use: Never used  Substance and Sexual Activity   Alcohol use: No   Drug use: No   Sexual activity: Not on file  Other Topics Concern   Not on file  Social History Narrative   Not  on file   Social Determinants of Health   Financial Resource Strain: Not on file  Food Insecurity: Not on file  Transportation Needs: Not on file  Physical Activity: Not on file  Stress: Not on file  Social Connections: Not on file     Family History: The patient's family history includes Anxiety disorder in her sister; Asthma in her sister; Atrial fibrillation in her sister; Diabetes in her mother; GER disease in her sister; Heart failure in her mother; Hypertension in her brother. There is no history of Thyroid disease.  ROS:   Please see the history of present illness.    All other systems reviewed and are negative.  EKGs/Labs/Other Studies Reviewed:      The following studies were reviewed today: Echo 07/08/2022: 1. Left ventricular ejection fraction, by estimation, is 55 to 60%. The  left ventricle has normal function. The left ventricle has no regional  wall motion abnormalities. There is mild left ventricular hypertrophy.  Left ventricular diastolic parameters  are indeterminate.   2. Right ventricular systolic function is normal. The right ventricular  size is normal. There is severely elevated pulmonary artery systolic  pressure.   3. Left atrial size was severely dilated.   4. Right atrial size was moderately dilated.   5. The mitral valve is normal in structure. Moderate to severe mitral  valve regurgitation. No evidence of mitral stenosis. Moderate mitral  annular calcification.   6. Tricuspid valve regurgitation is moderate.   7. The aortic valve is normal in structure. Aortic valve regurgitation is  mild. No aortic stenosis is present.   8. The inferior vena cava is normal in size with greater than 50%  respiratory variability, suggesting right atrial pressure of 3 mmHg.   Cardiac Cath 07/28/2022: Coronary Angiography Findings:  --LM: Ostial 25%  --LAD: Prox 40%, mid 30%, followed by 30%  --LCx: Distal diffusely diseased 50%  --RCA: Mid diffusely diseased  30%  --AV crossed: No   RHC Data: As below  Procedural Statistics   Pressures   RA 14 mmHg/16 mmHg/13 mmHg  RV 62 mmHg/9 mmHg/RVEDP: 13 mmHg  PA 61 mmHg/33 mmHg/44 mmHg  PCW 29 mmHg/30 mmHg/29 mmHg  LA  / /   LV  / /   AO 141 mmHg/81 mmHg/113 mmHg  BP 160 mmHg/117 mmHg/135 mmHg   Saturations   IVC Saturation O2-Sat    SVC Saturation O2-Sat 59.6 %  RA Saturation O2-Sat    RV Saturation O2-Sat    PA Saturation O2-Sat 56.7 %  PCW Saturation O2-Sat    AO Saturation O2-Sat 93.6 %   Derived Parameters   PVR    PVR Index    SVR    SVR Index    TD Aortic Flow    Thermal TSVR 47.3  Thermal SVR 41.78  Thermal TPVR 18.36  Thermal PVR 6.22  Thermal TPVR TSVR 0.39  Thermal PVR SVR 0.15  TD Stroke Volume  26.54   Cardiac Output   Fick A-V O2 Diff    Fick O2 Consumption 164.96  Fick Predicted O2 Consumption 164.96  Fick CO 2.59 L/min  Fick CI 1.5  Thermo CO 2.38 L/min  Thermo CI 1.4   Ventricular Data   LV Max DP/DT    LV Max DP/DT/P    RV Max DP/DT    RV Max DP/DT/P     Comments: Cath showed mild-moderate non-obstructive CAD. RHC numbers as  above, but overall elevated right and left sided filling pressures.   NB: Attempted right radial but sheath wire would not pass the elbow,  therefore switched to femoral access. Would avoid right wrist access in  the future.   Hemostasis: Manual pressure for R-internal jugular vein and R-femoral  artery  EBL: <10 cc  Specimens: none  Complications: none   Recent Labs: 11/27/2021: Hemoglobin 13.6; Platelets 259 08/25/2022: ALT 12; BUN 35; Creatinine, Ser 1.61; NT-Pro BNP 5,911; Potassium 3.5; Sodium 143  Recent Lipid Panel    Component Value Date/Time   CHOL 127 04/21/2019 0917   TRIG 164 (H) 04/21/2019 0917   HDL 33 (L) 04/21/2019 0917   CHOLHDL 3.8 04/21/2019 0917   LDLCALC 66 04/21/2019 0917                Physical Exam:    VS:  BP 132/82 (BP Location: Right Arm, Patient Position: Sitting, Cuff Size: Normal)    Pulse 60   Ht 5' 1" (1.549 m)   Wt 152 lb 6.4 oz (69.1 kg)   SpO2 97%   BMI 28.80 kg/m     Wt Readings from Last 3 Encounters:  10/09/22 152 lb 6.4 oz (69.1 kg)  10/02/22 152 lb 9.6 oz (69.2 kg)  09/22/22 164 lb (74.4 kg)     GEN:  Pleasant elderly woman in no acute distress HEENT: Normal NECK: No JVD; No carotid bruits LYMPHATICS: No lymphadenopathy CARDIAC: RRR, 3/6 holosystolic murmur at the apex RESPIRATORY:  Clear to auscultation without rales, wheezing or rhonchi  ABDOMEN: Soft, non-tender, non-distended MUSCULOSKELETAL:  mild pretibial edema bilaterally; No deformity  SKIN: Warm and dry NEUROLOGIC:  Alert and oriented x 3 PSYCHIATRIC:  Normal affect   ASSESSMENT:    1. Severe mitral regurgitation   2. Pre-procedural cardiovascular examination    PLAN:    In order of problems listed above:  The patient appears to have at least moderately severe mitral regurgitation by echo assessment.  I reviewed her echo images that are available in our system from her November 2023 echo.  She has preserved LV systolic function with no evidence of regional wall motion abnormality.  She has significant annular calcification as well as chordal calcification.  I cannot find any transmitral gradients, but in reviewing her Doppler information it does not appear that she likely has significant mitral stenosis.  I reviewed the natural history of mitral regurgitation with the patient who has symptoms now well compensated of chronic diastolic heart failure [place] HA functional class II.  I think in order to provide definitive guidance on whether she may or may not be a candidate for any transcatheter mitral valve therapy, she should undergo transesophageal echo.  My suspicion is that she will have too much annular and subvalvular calcification to make her a good candidate for edge-to-edge repair.  However, this needs to be better defined by TEE.  I reviewed the risks, indications, and alternatives to TEE  with the patient and her husband today.  They strongly desire the definitive evaluation.  They understand that risks of esophageal injury or significant complication from TEE are low and occur less than 1% of the time.  I will follow-up with them after her TEE is completed.  No changes are made in her medical regimen today.      Shared Decision Making/Informed Consent The risks [esophageal damage, perforation (1:10,000 risk), bleeding, pharyngeal hematoma as well as other potential complications associated with conscious sedation including aspiration, arrhythmia, respiratory failure and death], benefits (treatment guidance and diagnostic support) and alternatives of a transesophageal echocardiogram were discussed in detail with Kerri Rogers and she is willing to proceed.     Medication Adjustments/Labs and Tests Ordered: Current medicines are reviewed at length with the patient today.  Concerns regarding medicines are outlined above.  Orders Placed This Encounter  Procedures   CBC   Basic metabolic panel   EKG 12-Lead   No orders of the defined types were placed in this encounter.   Patient Instructions  Medication Instructions:  Your physician recommends that you continue on your current medications as directed. Please refer to the Current Medication list given to you today.  *If you need a refill on your cardiac medications before your next appointment, please call your pharmacy*  Lab Work: CBC, BMET (within one week of   procedure) If you have labs (blood work) drawn today and your tests are completely normal, you will receive your results only by: MyChart Message (if you have MyChart) OR A paper copy in the mail If you have any lab test that is abnormal or we need to change your treatment, we will call you to review the results.  Testing/Procedures: Transesophageal Echocardiogram Your physician has requested that you have a TEE. During a TEE, sound waves are used to create images of  your heart. It provides your doctor with information about the size and shape of your heart and how well your heart's chambers and valves are working. In this test, a transducer is attached to the end of a flexible tube that's guided down your throat and into your esophagus (the tube leading from you mouth to your stomach) to get a more detailed image of your heart. You are not awake for the procedure. Please see the instruction sheet given to you today. For further information please visit [email or web address].  Follow-Up: At [facility], you and your health needs are our priority.  As part of our continuing mission to provide you with exceptional heart care, we have created designated Provider Care Teams.  These Care Teams include your primary Cardiologist (physician) and Advanced Practice Providers (APPs -  Physician Assistants and Nurse Practitioners) who all work together to provide you with the care you need, when you need it.  We recommend signing up for the patient portal called "MyChart".  Sign up information is provided on this After Visit Summary.  MyChart is used to connect with patients for Virtual Visits (Telemedicine).  Patients are able to view lab/test results, encounter notes, upcoming appointments, etc.  Non-urgent messages can be sent to your provider as well.   To learn more about what you can do with MyChart, go to [email or web address].    Your next appointment:   Structural Team will follow-up  Provider:   Zavier Canela, MD     Other Instructions     Dear Kerri Rogers  You are scheduled for a TEE (Transesophageal Echocardiogram) on Tuesday, March 12 with Dr. Nishan.  Please arrive at the [facility] (Main Entrance A) at [facility]: [address] [place], [place] [address] at 7:30 AM.    DIET:  Nothing to eat or drink after midnight except a sip of water with medications (see medication instructions below)  MEDICATION INSTRUCTIONS:  DO NOT TAKE  Furosemide the morning of procedure Continue taking your anticoagulant (blood thinner): Rivaroxaban (Xarelto).  You will need to continue this after your procedure until you are told by your provider that it is safe to stop.    LABS: CBC, BMET (within 7 days of procedure)  FYI:  For your safety, and to allow us to monitor your vital signs accurately during the surgery/procedure we request: If you have artificial nails, gel coating, SNS etc, please have those removed prior to your surgery/procedure. Not having the nail coverings /polish removed may result in cancellation or delay of your surgery/procedure.  You must have a responsible person to drive you home and stay in the waiting area during your procedure. Failure to do so could result in cancellation.  Bring your insurance cards.  *Special Note: Every effort is made to have your procedure done on time. Occasionally there are emergencies that occur at the hospital that may cause delays. Please be patient if a delay does occur.       Signed, Tahjae Clausing, MD    10/10/2022 1:00 PM    [facility] 

## 2022-10-10 ENCOUNTER — Encounter: Payer: Self-pay | Admitting: Cardiovascular Disease

## 2022-10-21 NOTE — Addendum Note (Signed)
Addended by: Truddie Hidden on: 10/21/2022 02:37 PM   Modules accepted: Orders

## 2022-10-22 ENCOUNTER — Telehealth: Payer: Self-pay

## 2022-10-22 LAB — CBC
Hematocrit: 37 % (ref 34.0–46.6)
Hemoglobin: 12.3 g/dL (ref 11.1–15.9)
MCH: 29.5 pg (ref 26.6–33.0)
MCHC: 33.2 g/dL (ref 31.5–35.7)
MCV: 89 fL (ref 79–97)
Platelets: 209 10*3/uL (ref 150–450)
RBC: 4.17 x10E6/uL (ref 3.77–5.28)
RDW: 13.2 % (ref 11.7–15.4)
WBC: 6.8 10*3/uL (ref 3.4–10.8)

## 2022-10-22 LAB — BASIC METABOLIC PANEL
BUN/Creatinine Ratio: 20 (ref 12–28)
BUN: 41 mg/dL — ABNORMAL HIGH (ref 8–27)
CO2: 20 mmol/L (ref 20–29)
Calcium: 10.3 mg/dL (ref 8.7–10.3)
Chloride: 102 mmol/L (ref 96–106)
Creatinine, Ser: 2.05 mg/dL — ABNORMAL HIGH (ref 0.57–1.00)
Glucose: 106 mg/dL — ABNORMAL HIGH (ref 70–99)
Potassium: 5.3 mmol/L — ABNORMAL HIGH (ref 3.5–5.2)
Sodium: 137 mmol/L (ref 134–144)
eGFR: 23 mL/min/{1.73_m2} — ABNORMAL LOW (ref >59)

## 2022-10-22 LAB — URIC ACID: Uric Acid: 6.7 mg/dL (ref 3.1–7.9)

## 2022-10-22 NOTE — Telephone Encounter (Signed)
Left message to call the clinic. Patient potassium is 5.3 , BUN 41 and patient is prescribed to take potassium chloride M-W-F. Spoke to DOD: Advised patient to hold Friday's dose of potassium chloride and await further instructions from Dr. Burt Knack.

## 2022-10-22 NOTE — Telephone Encounter (Signed)
Pt spouse returning call

## 2022-10-22 NOTE — Telephone Encounter (Signed)
The patient has been notified of the result and verbalized understanding.  All questions (if any) were answered. Gershon Crane, LPN [id number] PM

## 2022-10-27 NOTE — Anesthesia Preprocedure Evaluation (Signed)
Anesthesia Evaluation  Patient identified by MRN, date of birth, ID band Patient awake    Reviewed: Allergy & Precautions, NPO status , Patient's Chart, lab work & pertinent test results, reviewed documented beta blocker date and time   Airway Mallampati: II  TM Distance: >3 FB Neck ROM: Full    Dental  (+) Edentulous Upper, Edentulous Lower, Dental Advisory Given   Pulmonary asthma , COPD, PE   Pulmonary exam normal breath sounds clear to auscultation       Cardiovascular hypertension, Pt. on home beta blockers and Pt. on medications + CAD and +CHF  Normal cardiovascular exam+ dysrhythmias Atrial Fibrillation and Ventricular Tachycardia + Valvular Problems/Murmurs (severe MR) MR  Rhythm:Regular Rate:Normal  TTE 2023  1. Left ventricular ejection fraction, by estimation, is 55 to 60%. The  left ventricle has normal function. The left ventricle has no regional  wall motion abnormalities. There is mild left ventricular hypertrophy.  Left ventricular diastolic parameters  are indeterminate.   2. Right ventricular systolic function is normal. The right ventricular  size is normal. There is severely elevated pulmonary artery systolic  pressure.   3. Left atrial size was severely dilated.   4. Right atrial size was moderately dilated.   5. The mitral valve is normal in structure. Moderate to severe mitral  valve regurgitation. No evidence of mitral stenosis. Moderate mitral  annular calcification.   6. Tricuspid valve regurgitation is moderate.   7. The aortic valve is normal in structure. Aortic valve regurgitation is  mild. No aortic stenosis is present.   8. The inferior vena cava is normal in size with greater than 50%  respiratory variability, suggesting right atrial pressure of 3 mmHg.      Neuro/Psych negative neurological ROS  negative psych ROS   GI/Hepatic Neg liver ROS,GERD  ,,  Endo/Other  diabetes     Renal/GU Renal InsufficiencyRenal disease  negative genitourinary   Musculoskeletal negative musculoskeletal ROS (+)    Abdominal   Peds  Hematology  (+) Blood dyscrasia (xarelto)   Anesthesia Other Findings   Reproductive/Obstetrics                             Anesthesia Physical Anesthesia Plan  ASA: 3  Anesthesia Plan: MAC   Post-op Pain Management:    Induction: Intravenous  PONV Risk Score and Plan: Propofol infusion and Treatment may vary due to age or medical condition  Airway Management Planned: Natural Airway  Additional Equipment:   Intra-op Plan:   Post-operative Plan:   Informed Consent: I have reviewed the patients History and Physical, chart, labs and discussed the procedure including the risks, benefits and alternatives for the proposed anesthesia with the patient or authorized representative who has indicated his/her understanding and acceptance.     Dental advisory given  Plan Discussed with: CRNA  Anesthesia Plan Comments:        Anesthesia Quick Evaluation

## 2022-10-28 ENCOUNTER — Encounter (HOSPITAL_COMMUNITY): Payer: Self-pay | Admitting: Cardiovascular Disease

## 2022-10-28 ENCOUNTER — Other Ambulatory Visit: Payer: Self-pay

## 2022-10-28 ENCOUNTER — Ambulatory Visit (HOSPITAL_BASED_OUTPATIENT_CLINIC_OR_DEPARTMENT_OTHER)
Admission: RE | Admit: 2022-10-28 | Discharge: 2022-10-28 | Disposition: A | Discharge status: Home / Self Care | Payer: PPO | Source: Ambulatory Visit | Attending: Cardiovascular Disease | Admitting: Cardiovascular Disease

## 2022-10-28 ENCOUNTER — Encounter (HOSPITAL_COMMUNITY)
Admission: RE | Disposition: A | Discharge status: Home / Self Care | Payer: Self-pay | Source: Home / Self Care | Attending: Cardiovascular Disease

## 2022-10-28 ENCOUNTER — Ambulatory Visit (HOSPITAL_COMMUNITY)
Admission: RE | Admit: 2022-10-28 | Discharge: 2022-10-28 | Disposition: A | Discharge status: Home / Self Care | Payer: PPO | Attending: Cardiovascular Disease | Admitting: Cardiovascular Disease

## 2022-10-28 ENCOUNTER — Ambulatory Visit (HOSPITAL_BASED_OUTPATIENT_CLINIC_OR_DEPARTMENT_OTHER): Payer: PPO | Admitting: Anesthesiology

## 2022-10-28 ENCOUNTER — Ambulatory Visit (HOSPITAL_COMMUNITY): Payer: PPO | Admitting: Anesthesiology

## 2022-10-28 DIAGNOSIS — I2510 Atherosclerotic heart disease of native coronary artery without angina pectoris: Secondary | ICD-10-CM | POA: Diagnosis not present

## 2022-10-28 DIAGNOSIS — I361 Nonrheumatic tricuspid (valve) insufficiency: Secondary | ICD-10-CM | POA: Diagnosis not present

## 2022-10-28 DIAGNOSIS — I110 Hypertensive heart disease with heart failure: Secondary | ICD-10-CM | POA: Diagnosis not present

## 2022-10-28 DIAGNOSIS — I083 Combined rheumatic disorders of mitral, aortic and tricuspid valves: Secondary | ICD-10-CM | POA: Insufficient documentation

## 2022-10-28 DIAGNOSIS — I5032 Chronic diastolic (congestive) heart failure: Secondary | ICD-10-CM | POA: Insufficient documentation

## 2022-10-28 DIAGNOSIS — I509 Heart failure, unspecified: Secondary | ICD-10-CM

## 2022-10-28 DIAGNOSIS — I340 Nonrheumatic mitral (valve) insufficiency: Secondary | ICD-10-CM

## 2022-10-28 DIAGNOSIS — J449 Chronic obstructive pulmonary disease, unspecified: Secondary | ICD-10-CM

## 2022-10-28 HISTORY — PX: TEE WITHOUT CARDIOVERSION: SHX5443

## 2022-10-28 LAB — POCT I-STAT, CHEM 8
BUN: 42 mg/dL — ABNORMAL HIGH (ref 8–23)
Calcium, Ion: 1.33 mmol/L (ref 1.15–1.40)
Chloride: 105 mmol/L (ref 98–111)
Creatinine, Ser: 2.2 mg/dL — ABNORMAL HIGH (ref 0.44–1.00)
Glucose, Bld: 93 mg/dL (ref 70–99)
HCT: 39 % (ref 36.0–46.0)
Hemoglobin: 13.3 g/dL (ref 12.0–15.0)
Potassium: 4.4 mmol/L (ref 3.5–5.1)
Sodium: 139 mmol/L (ref 135–145)
TCO2: 25 mmol/L (ref 22–32)

## 2022-10-28 LAB — ECHO TEE
MV M vel: 5.98 m/s
MV Peak grad: 143 mmHg
Radius: 0.5 cm

## 2022-10-28 SURGERY — ECHOCARDIOGRAM, TRANSESOPHAGEAL
Anesthesia: Monitor Anesthesia Care

## 2022-10-28 MED ORDER — PROPOFOL 500 MG/50ML IV EMUL
INTRAVENOUS | Status: DC | PRN
  Administered 2022-10-28: 100 ug/kg/min via INTRAVENOUS

## 2022-10-28 MED ORDER — PROPOFOL 10 MG/ML IV BOLUS
INTRAVENOUS | Status: DC | PRN
  Administered 2022-10-28 (×2): 20 mg via INTRAVENOUS

## 2022-10-28 MED ORDER — AMIODARONE HCL 150 MG/3ML IV SOLN
150.0000 mg | Freq: Once | INTRAVENOUS | Status: DC
  Filled 2022-10-28: qty 3

## 2022-10-28 MED ORDER — SODIUM CHLORIDE 0.9 % IV SOLN: INTRAVENOUS | Status: DC

## 2022-10-28 NOTE — CV Procedure (Signed)
TEE: Anesthesia: Propofol   Severe functional MR due to severe LAE and tethering of leaflets especially posterior Valve not ideal for clip with large area of calcium on A2/A3 leaflets and short retracted P2/P3 leaflet that is calcified along with sub chords  Mild TR AV sclerosis with mild AR No LAA thrombus EF 55% Normal RV No effusion   See full report in [employer]  Jenkins Rouge MD [facility]

## 2022-10-28 NOTE — Anesthesia Procedure Notes (Signed)
Procedure Name: MAC Date/Time: 10/28/2022 8:38 AM  Performed by: Harden Mo, CRNAPre-anesthesia Checklist: Patient identified, Emergency Drugs available, Suction available and Patient being monitored Patient Re-evaluated:Patient Re-evaluated prior to induction Oxygen Delivery Method: Nasal cannula Preoxygenation: Pre-oxygenation with 100% oxygen Induction Type: IV induction Placement Confirmation: positive ETCO2 and breath sounds checked- equal and bilateral Dental Injury: Teeth and Oropharynx as per pre-operative assessment

## 2022-10-28 NOTE — Interval H&P Note (Signed)
History and Physical Interval Note:  10/28/2022 8:30 AM  Kerri Rogers  has presented today for surgery, with the diagnosis of MITRAL REGURGITATION.  The various methods of treatment have been discussed with the patient and family. After consideration of risks, benefits and other options for treatment, the patient has consented to  Procedure(s): TRANSESOPHAGEAL ECHOCARDIOGRAM (TEE) (N/A) as a surgical intervention.  The patient's history has been reviewed, patient examined, no change in status, stable for surgery.  I have reviewed the patient's chart and labs.  Questions were answered to the patient's satisfaction.     Jenkins Rouge

## 2022-10-28 NOTE — Transfer of Care (Signed)
Immediate Anesthesia Transfer of Care Note  Patient: Kerri Rogers  Procedure(s) Performed: TRANSESOPHAGEAL ECHOCARDIOGRAM (TEE)  Patient Location: PACU  Anesthesia Type:MAC  Level of Consciousness: awake, alert , and oriented  Airway & Oxygen Therapy: Patient Spontanous Breathing and Patient connected to nasal cannula oxygen  Post-op Assessment: Report given to RN, Post -op Vital signs reviewed and stable, and Patient moving all extremities X 4  Post vital signs: Reviewed and stable  Last Vitals:  Vitals Value Taken Time  BP 122/75   Temp    Pulse 112   Resp 14   SpO2 95     Last Pain:  Vitals:   10/28/22 0740  TempSrc: Temporal  PainSc: 0-No pain         Complications: No notable events documented.

## 2022-10-28 NOTE — Progress Notes (Signed)
  Echocardiogram Echocardiogram Transesophageal has been performed.  Kerri Rogers 10/28/2022, 9:08 AM

## 2022-10-29 ENCOUNTER — Telehealth: Payer: Self-pay

## 2022-10-29 NOTE — Telephone Encounter (Signed)
Per [employer], "For patient DS: This is Secondary MR and does not require a surgical consult  This valve looks suitable for a MitraClip implant. In the Bicaval and SAXB views, the fossa looks reasonable for a transseptal puncture. The LA dimensions are large enough for steering and straddle of the Clip Delivery System. The MR jet is broad based and centrally located. The posterior leaflet measures about 7 mm in the 103 LVOT grasping view. Gradient measures 3 mmHg (86 HR); MVA measures 3.67 cm2 by Transgastric. Based on the size of the posterior leaflet and MVA, I'd recommend an NTW and assess for gradient. I think we will need to be strategic about where to place the clip for leaflet length."

## 2022-10-29 NOTE — Anesthesia Postprocedure Evaluation (Signed)
Anesthesia Post Note  Patient: Kerri Rogers  Procedure(s) Performed: TRANSESOPHAGEAL ECHOCARDIOGRAM (TEE)     Patient location during evaluation: PACU Anesthesia Type: MAC Level of consciousness: awake and alert Pain management: pain level controlled Vital Signs Assessment: post-procedure vital signs reviewed and stable Respiratory status: spontaneous breathing, nonlabored ventilation, respiratory function stable and patient connected to nasal cannula oxygen Cardiovascular status: stable and blood pressure returned to baseline Postop Assessment: no apparent nausea or vomiting Anesthetic complications: no  No notable events documented.  Last Vitals:  Vitals:   10/28/22 0930 10/28/22 0945  BP: 139/77 132/83  Pulse: (!) 107 85  Resp: 16 13  Temp:  36.8 C  SpO2: 97% 100%    Last Pain:  Vitals:   10/28/22 0930  TempSrc:   PainSc: 0-No pain                 [name] [name]

## 2022-10-31 ENCOUNTER — Encounter (HOSPITAL_COMMUNITY): Payer: Self-pay | Admitting: Cardiovascular Disease

## 2022-11-03 ENCOUNTER — Telehealth: Payer: Self-pay

## 2022-11-06 ENCOUNTER — Encounter: Payer: Self-pay | Admitting: Cardiovascular Disease

## 2022-11-06 NOTE — Progress Notes (Signed)
Patient's case reviewed at our multidisciplinary heart team meeting.  The patient has undergone TEE to evaluate treatment options for mitral regurgitation.  We all agree that her mitral valve anatomy is not suitable for transcatheter edge-to-edge repair with either Pascal or MitraClip.  There is too much calcium at the leaflet tips of the mitral valve and we feel that potential risk would outweigh benefits for this patient.  I called her husband and spoke with him at length.  He understands and appreciates the discussion.  She will continue with medical therapy under the care of of Dr. Agustin Cree and her primary care doctor.  Sherren Mocha 11/06/2022 1:40 PM

## 2022-11-06 NOTE — Telephone Encounter (Signed)
Spoke with patient's husband. They understand that she is not a candidate for TEER due to leaflet calcium. Please see other note.

## 2022-11-25 LAB — LAB REPORT - SCANNED: EGFR: 35

## 2022-11-27 ENCOUNTER — Ambulatory Visit: Payer: PPO | Admitting: Cardiology

## 2022-12-02 ENCOUNTER — Ambulatory Visit: Payer: PPO | Attending: Cardiology | Admitting: Cardiology

## 2022-12-02 ENCOUNTER — Encounter: Payer: Self-pay | Admitting: Cardiology

## 2022-12-02 VITALS — BP 104/56 | HR 81 | Ht 62.0 in | Wt 155.8 lb

## 2022-12-02 DIAGNOSIS — I10 Essential (primary) hypertension: Secondary | ICD-10-CM | POA: Diagnosis not present

## 2022-12-02 DIAGNOSIS — R0602 Shortness of breath: Secondary | ICD-10-CM

## 2022-12-02 DIAGNOSIS — I340 Nonrheumatic mitral (valve) insufficiency: Secondary | ICD-10-CM

## 2022-12-02 DIAGNOSIS — I272 Pulmonary hypertension, unspecified: Secondary | ICD-10-CM

## 2022-12-02 DIAGNOSIS — I4891 Unspecified atrial fibrillation: Secondary | ICD-10-CM

## 2022-12-02 DIAGNOSIS — I50814 Right heart failure due to left heart failure: Secondary | ICD-10-CM

## 2022-12-02 DIAGNOSIS — I428 Other cardiomyopathies: Secondary | ICD-10-CM

## 2022-12-02 NOTE — Patient Instructions (Signed)
Medication Instructions:  Your physician recommends that you continue on your current medications as directed. Please refer to the Current Medication list given to you today.  *If you need a refill on your cardiac medications before your next appointment, please call your pharmacy*   Lab Work: BMP, ProBNP- today If you have labs (blood work) drawn today and your tests are completely normal, you will receive your results only by: MyChart Message (if you have MyChart) OR A paper copy in the mail If you have any lab test that is abnormal or we need to change your treatment, we will call you to review the results.   Testing/Procedures: None Ordered   Follow-Up: At [facility], you and your health needs are our priority.  As part of our continuing mission to provide you with exceptional heart care, we have created designated Provider Care Teams.  These Care Teams include your primary Cardiologist (physician) and Advanced Practice Providers (APPs -  Physician Assistants and Nurse Practitioners) who all work together to provide you with the care you need, when you need it.  We recommend signing up for the patient portal called "MyChart".  Sign up information is provided on this After Visit Summary.  MyChart is used to connect with patients for Virtual Visits (Telemedicine).  Patients are able to view lab/test results, encounter notes, upcoming appointments, etc.  Non-urgent messages can be sent to your provider as well.   To learn more about what you can do with MyChart, go to [email or web address].    Your next appointment:   1 month(s)  The format for your next appointment:   In Person  Provider:   Robert Krasowski, MD    Other Instructions NA  

## 2022-12-02 NOTE — Progress Notes (Signed)
Cardiology Office Note:    Date:  12/02/2022   ID:  Kerri Rogers, DOB 08-Mar-1935, MRN [id number]  PCP:  Judge Stall, MD  Cardiologist:  Gypsy Balsam, MD    Referring MD: Judge Stall, MD   Chief Complaint  Patient presents with   Follow-up  Doing fine  History of Present Illness:    Kerri Rogers is a 88 y.o. female past medical history significant for cardiomyopathy with estimation of ejection fraction 35 to 40% severe mitral regurgitation, cardiac catheterization done in December 2023 showed 25% left main 40 proximal LAD 30 mid 30 distal LAD, 50% circumflex 30% of RCA.  Right-sided cardiac catheterization showed pulmonary hypertension with mean pulmonary pressure of 44 mmHg.  She was also find to have significant mitral regurgitation she was evaluated by our structural heart disease program for potential mitral valve clip, she did have transesophageal echocardiogram done which showed severe mitral regurgitation but leaflets are quite heavily calcified she was felt not to be candidate for this procedure.  She comes today to my office for follow-up.  Overall seems to be doing well.  She denies have any chest pain tightness squeezing pressure burning chest.  Her primary care physician put her on Entresto 24-26, he cut down his diuretic and discontinue her metoprolol.  She seems to be holding quite okay.  Her husband is relieved with taking excellent care of her, he takes her weight every single day so far things are stable and be very careful with management of her fluids.  Past Medical History:  Diagnosis Date   Asthma with bronchitis and status asthmaticus    Atrial fibrillation    Atrial flutter, paroxysmal 03/01/2015   B12 deficiency    Bladder spasm    CHF (congestive heart failure)    Chronic constipation    COPD (chronic obstructive pulmonary disease)    Edema    Essential hypertension 03/01/2015   GERD (gastroesophageal reflux disease)    Gout    Herniated lumbar  intervertebral disc    History of pulmonary embolism 03/01/2015   Hypertension    Insomnia    Long term (current) use of anticoagulants    Overactive bladder    Peripheral autonomic neuropathy    Pre-ulcerative calluses    Shortness of breath 06/18/2022   Skin rash    Stasis dermatitis    Ventral hernia    Vitamin D deficiency     Past Surgical History:  Procedure Laterality Date   ABDOMINAL HYSTERECTOMY     BLADDER SURGERY     CATARACT EXTRACTION     REPLACEMENT TOTAL KNEE BILATERAL     TEE WITHOUT CARDIOVERSION N/A 10/28/2022   Procedure: TRANSESOPHAGEAL ECHOCARDIOGRAM (TEE);  Surgeon: Wendall Stade, MD;  Location[facility] ENDOSCOPY;  Service: Cardiovascular;  Laterality: N/A;   TONSILLECTOMY      Current Medications: Current Meds  Medication Sig   acetaminophen (TYLENOL) 500 MG tablet Take 1,000 mg by mouth 2 (two) times daily as needed for mild pain or moderate pain.   albuterol (VENTOLIN HFA) 108 (90 Base) MCG/ACT inhaler Inhale 1-2 puffs into the lungs every 4 (four) hours as needed for shortness of breath or wheezing.   CINNAMON PO Take 1,000 mg by mouth daily.   dapagliflozin propanediol (FARXIGA) 5 MG TABS tablet Take 5 mg by mouth daily.   digoxin (LANOXIN) 0.125 MG tablet Take 0.0625 mg by mouth daily.   diphenhydramine-acetaminophen (TYLENOL PM) 25-500 MG TABS tablet Take 2 tablets by mouth at bedtime as needed (  sleep).   ENTRESTO 24-26 MG Take 1 tablet by mouth 2 (two) times daily.   ferrous gluconate (FERGON) 324 MG tablet Take 324 mg by mouth daily with breakfast.   ferrous sulfate 325 (65 FE) MG tablet Take 325 mg by mouth daily.   fluticasone (FLONASE) 50 MCG/ACT nasal spray Place 2 sprays into both nostrils daily as needed for allergies.   furosemide (LASIX) 20 MG tablet Take 20 mg by mouth daily. Take 40 mg twice daily until 3/9 then start taking 30 mg twice daily on 3/10   hydrOXYzine (ATARAX) 25 MG tablet Take 1 tablet (25 mg total) by mouth every 6 (six)  hours as needed for anxiety or itching.   latanoprost (XALATAN) 0.005 % ophthalmic solution Place 1 drop into both eyes at bedtime.   magnesium oxide (MAG-OX) 400 MG tablet Take 400 mg by mouth every Monday, Wednesday, and Friday.   melatonin 5 MG TABS Take 5 mg by mouth at bedtime.   montelukast (SINGULAIR) 10 MG tablet Take 10 mg by mouth daily.   Multiple Vitamin (MULTIVITAMIN) tablet Take 1 tablet by mouth daily.   nitroGLYCERIN (NITROSTAT) 0.4 MG SL tablet Place 0.4 mg under the tongue every 5 (five) minutes as needed for chest pain.   oxybutynin (DITROPAN-XL) 10 MG 24 hr tablet Take 10 mg by mouth daily.   polyethylene glycol powder (GOODSENSE CLEARLAX) 17 GM/SCOOP powder Take 17 g by mouth daily.   potassium chloride SA (KLOR-CON M) 20 MEQ tablet Take 20 mEq by mouth daily.   SSD 1 % cream Apply 1 Application topically 2 (two) times daily.   XARELTO 15 MG TABS tablet TAKE 1 TABLET BY MOUTH ONCE DAILY WITH SUPPER (Patient taking differently: Take 15 mg by mouth daily with supper.)   Zinc 50 MG TABS Take 50 mg by mouth daily.   [DISCONTINUED] metoprolol tartrate (LOPRESSOR) 25 MG tablet Take 25 mg by mouth 2 (two) times daily.     Allergies:   Flagyl [metronidazole], Penicillins, and Sulfamethoxazole-trimethoprim   Social History   Socioeconomic History   Marital status: Married    Spouse name: Not on file   Number of children: Not on file   Years of education: Not on file   Highest education level: Not on file  Occupational History   Not on file  Tobacco Use   Smoking status: Never   Smokeless tobacco: Never  Vaping Use   Vaping Use: Never used  Substance and Sexual Activity   Alcohol use: No   Drug use: No   Sexual activity: Not on file  Other Topics Concern   Not on file  Social History Narrative   Not on file   Social Determinants of Health   Financial Resource Strain: Not on file  Food Insecurity: Not on file  Transportation Needs: Not on file  Physical  Activity: Not on file  Stress: Not on file  Social Connections: Not on file     Family History: The patient's family history includes Anxiety disorder in her sister; Asthma in her sister; Atrial fibrillation in her sister; Diabetes in her mother; GER disease in her sister; Heart failure in her mother; Hypertension in her brother. There is no history of Thyroid disease. ROS:   Please see the history of present illness.    All 14 point review of systems negative except as described per history of present illness  EKGs/Labs/Other Studies Reviewed:      Recent Labs: 08/25/2022: ALT 12; NT-Pro BNP 5,911 10/21/2022:  Platelets 209 10/28/2022: BUN 42; Creatinine, Ser 2.20; Hemoglobin 13.3; Potassium 4.4; Sodium 139  Recent Lipid Panel    Component Value Date/Time   CHOL 127 04/21/2019 0917   TRIG 164 (H) 04/21/2019 0917   HDL 33 (L) 04/21/2019 0917   CHOLHDL 3.8 04/21/2019 0917   LDLCALC 66 04/21/2019 0917    Physical Exam:    VS:  BP (!) 104/56 (BP Location: Left Arm, Patient Position: Sitting)   Pulse 81   Ht  (1.575 m)   Wt 155 lb 12.8 oz (70.7 kg)   SpO2 94%   BMI 28.50 kg/m     Wt Readings from Last 3 Encounters:  12/02/22 155 lb 12.8 oz (70.7 kg)  10/28/22 152 lb 6.4 oz (69.1 kg)  10/09/22 152 lb 6.4 oz (69.1 kg)     GEN:  Well nourished, well developed in no acute distress HEENT: Normal NECK: No JVD; No carotid bruits LYMPHATICS: No lymphadenopathy CARDIAC: RRR, holosystolic murmur grade 2/6 best heard left border sternum, no rubs, no gallops RESPIRATORY:  Clear to auscultation without rales, wheezing or rhonchi  ABDOMEN: Soft, non-tender, non-distended MUSCULOSKELETAL:  No edema; No deformity  SKIN: Warm and dry LOWER EXTREMITIES: no swelling NEUROLOGIC:  Alert and oriented x 3 PSYCHIATRIC:  Normal affect   ASSESSMENT:    1. Shortness of breath   2. Atrial fibrillation, unspecified type   3. Essential hypertension   4. 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 Nonischemic cardiomyopathy   6. Pulmonary HTN   7. Severe mitral regurgitation    PLAN:    In order of problems listed above:  Severe mitral regurgitation.  Stable from that continuing appropriate medications from not to be candidate for any intervention from cardiac standpoint review. Pulmonary hypertension most likely group 2 related to mitral regurgitation the key is to continue proper diuresis.  I am somewhat unhappy about the fact that her diuretic has been decreased.  Will check proBNP today as well as Chem-7. Nonischemic cardiomyopathy.  On medication that she can tolerate which I will continue. I did review TEE as well as note by Dr. Excell Seltzer before that visit   Medication Adjustments/Labs and Tests Ordered: Current medicines are reviewed at length with the patient today.  Concerns regarding medicines are outlined above.  Orders Placed This Encounter  Procedures   Basic metabolic panel   Pro b natriuretic peptide (BNP)   Medication changes: No orders of the defined types were placed in this encounter.   Signed, Georgeanna Lea, MD, [facility] 12/02/2022 4:35 PM    [facility]

## 2022-12-03 LAB — BASIC METABOLIC PANEL
BUN/Creatinine Ratio: 24 (ref 12–28)
BUN: 36 mg/dL — ABNORMAL HIGH (ref 8–27)
CO2: 18 mmol/L — ABNORMAL LOW (ref 20–29)
Calcium: 10.1 mg/dL (ref 8.7–10.3)
Chloride: 105 mmol/L (ref 96–106)
Creatinine, Ser: 1.47 mg/dL — ABNORMAL HIGH (ref 0.57–1.00)
Glucose: 116 mg/dL — ABNORMAL HIGH (ref 70–99)
Potassium: 4.8 mmol/L (ref 3.5–5.2)
Sodium: 138 mmol/L (ref 134–144)
eGFR: 35 mL/min/{1.73_m2} — ABNORMAL LOW (ref >59)

## 2022-12-03 LAB — PRO B NATRIURETIC PEPTIDE: NT-Pro BNP: 3640 pg/mL — ABNORMAL HIGH (ref 0–738)

## 2022-12-10 ENCOUNTER — Telehealth: Payer: Self-pay

## 2022-12-10 NOTE — Telephone Encounter (Signed)
Patient notified of results.

## 2022-12-10 NOTE — Telephone Encounter (Signed)
-----   Message from Georgeanna Lea, MD sent at 12/05/2022 12:17 PM EDT ----- All blood test actually show improvement kidney function is better also blood test for congestive heart failure is better.  Continue present management

## 2022-12-22 LAB — LAB REPORT - SCANNED: EGFR: 32

## 2023-01-06 ENCOUNTER — Ambulatory Visit: Payer: PPO | Attending: Cardiology | Admitting: Cardiology

## 2023-01-06 VITALS — BP 100/50 | HR 88 | Ht 61.0 in | Wt 156.0 lb

## 2023-01-06 DIAGNOSIS — I428 Other cardiomyopathies: Secondary | ICD-10-CM

## 2023-01-06 DIAGNOSIS — Z86711 Personal history of pulmonary embolism: Secondary | ICD-10-CM

## 2023-01-06 DIAGNOSIS — I10 Essential (primary) hypertension: Secondary | ICD-10-CM | POA: Diagnosis not present

## 2023-01-06 DIAGNOSIS — I4821 Permanent atrial fibrillation: Secondary | ICD-10-CM

## 2023-01-06 NOTE — Progress Notes (Unsigned)
Cardiology Office Note:    Date:  01/06/2023   ID:  Kerri Rogers, DOB [birth year], MRN [id number]  PCP:  Judge Stall, MD  Cardiologist:  Gypsy Balsam, MD    Referring MD: Judge Stall, MD   Chief Complaint  Patient presents with   Follow-up    History of Present Illness:    Kerri Rogers is a 88 y.o. female  past medical history significant for cardiomyopathy with estimation of ejection fraction 35 to 40% severe mitral regurgitation, cardiac catheterization done in December 2023 showed 25% left main 40 proximal LAD 30 mid 30 distal LAD, 50% circumflex 30% of RCA. Right-sided cardiac catheterization showed pulmonary hypertension with mean pulmonary pressure of 44 mmHg. She was also find to have significant mitral regurgitation she was evaluated by our structural heart disease program for potential mitral valve clip, she did have transesophageal echocardiogram done which showed severe mitral regurgitation but leaflets are quite heavily calcified she was felt not to be candidate for this procedure. She comes today to my office for follow-up.  Overall she seems to be doing quite well.  Not much shortness of breath.  She does have some home health nurse coming on the regular basis wrapping her legs and is doing well.  Biggest complaint that they have been in her and her husband is her CPAP mask which apparently is not well adjusted.  Did create some problems and she had difficulty sleeping with it.  Past Medical History:  Diagnosis Date   Asthma with bronchitis and status asthmaticus    Atrial fibrillation (HCC)    Atrial flutter, paroxysmal (HCC) 03/01/2015   B12 deficiency    Bladder spasm    CHF (congestive heart failure) (HCC)    Chronic constipation    COPD (chronic obstructive pulmonary disease) (HCC)    Edema    Essential hypertension 03/01/2015   GERD (gastroesophageal reflux disease)    Gout    Herniated lumbar intervertebral disc    History of pulmonary embolism  03/01/2015   Hypertension    Insomnia    Long term (current) use of anticoagulants    Overactive bladder    Peripheral autonomic neuropathy    Pre-ulcerative calluses    Shortness of breath 06/18/2022   Skin rash    Stasis dermatitis    Ventral hernia    Vitamin D deficiency     Past Surgical History:  Procedure Laterality Date   ABDOMINAL HYSTERECTOMY     BLADDER SURGERY     CATARACT EXTRACTION     REPLACEMENT TOTAL KNEE BILATERAL     TEE WITHOUT CARDIOVERSION N/A 10/28/2022   Procedure: TRANSESOPHAGEAL ECHOCARDIOGRAM (TEE);  Surgeon: Wendall Stade, MD;  Location: [facility] ENDOSCOPY;  Service: Cardiovascular;  Laterality: N/A;   TONSILLECTOMY      Current Medications: Current Meds  Medication Sig   acetaminophen (TYLENOL) 500 MG tablet Take 1,000 mg by mouth 2 (two) times daily as needed for mild pain or moderate pain.   albuterol (VENTOLIN HFA) 108 (90 Base) MCG/ACT inhaler Inhale 1-2 puffs into the lungs every 4 (four) hours as needed for shortness of breath or wheezing.   CINNAMON PO Take 1,000 mg by mouth daily.   dapagliflozin propanediol (FARXIGA) 5 MG TABS tablet Take 5 mg by mouth daily.   digoxin (LANOXIN) 0.125 MG tablet Take 0.0625 mg by mouth daily.   diphenhydramine-acetaminophen (TYLENOL PM) 25-500 MG TABS tablet Take 2 tablets by mouth at bedtime as needed (sleep).   ENTRESTO 24-26 MG Take 1  tablet by mouth 2 (two) times daily.   ferrous sulfate 325 (65 FE) MG tablet Take 325 mg by mouth daily.   fluticasone (FLONASE) 50 MCG/ACT nasal spray Place 2 sprays into both nostrils daily as needed for allergies.   furosemide (LASIX) 20 MG tablet Take 20 mg by mouth daily. Take 40 mg twice daily until 3/9 then start taking 30 mg twice daily on 3/10   hydrOXYzine (ATARAX) 25 MG tablet Take 1 tablet (25 mg total) by mouth every 6 (six) hours as needed for anxiety or itching.   latanoprost (XALATAN) 0.005 % ophthalmic solution Place 1 drop into both eyes at bedtime.   magnesium  oxide (MAG-OX) 400 MG tablet Take 400 mg by mouth every Monday, Wednesday, and Friday.   melatonin 5 MG TABS Take 5 mg by mouth at bedtime.   montelukast (SINGULAIR) 10 MG tablet Take 10 mg by mouth daily.   Multiple Vitamin (MULTIVITAMIN) tablet Take 1 tablet by mouth daily.   nitroGLYCERIN (NITROSTAT) 0.4 MG SL tablet Place 0.4 mg under the tongue every 5 (five) minutes as needed for chest pain.   oxybutynin (DITROPAN-XL) 10 MG 24 hr tablet Take 10 mg by mouth daily.   polyethylene glycol powder (GOODSENSE CLEARLAX) 17 GM/SCOOP powder Take 17 g by mouth daily.   SSD 1 % cream Apply 1 Application topically 2 (two) times daily.   XARELTO 15 MG TABS tablet TAKE 1 TABLET BY MOUTH ONCE DAILY WITH SUPPER (Patient taking differently: Take 15 mg by mouth daily with supper.)   Zinc 50 MG TABS Take 50 mg by mouth daily.     Allergies:   Flagyl [metronidazole], Penicillins, and Sulfamethoxazole-trimethoprim   Social History   Socioeconomic History   Marital status: Married    Spouse name: Not on file   Number of children: Not on file   Years of education: Not on file   Highest education level: Not on file  Occupational History   Not on file  Tobacco Use   Smoking status: Never   Smokeless tobacco: Never  Vaping Use   Vaping Use: Never used  Substance and Sexual Activity   Alcohol use: No   Drug use: No   Sexual activity: Not on file  Other Topics Concern   Not on file  Social History Narrative   Not on file   Social Determinants of Health   Financial Resource Strain: Not on file  Food Insecurity: Not on file  Transportation Needs: Not on file  Physical Activity: Not on file  Stress: Not on file  Social Connections: Not on file     Family History: The patient's family history includes Anxiety disorder in her sister; Asthma in her sister; Atrial fibrillation in her sister; Diabetes in her mother; GER disease in her sister; Heart failure in her mother; Hypertension in her  brother. There is no history of Thyroid disease. ROS:   Please see the history of present illness.    All 14 point review of systems negative except as described per history of present illness  EKGs/Labs/Other Studies Reviewed:      Recent Labs: 08/25/2022: ALT 12 10/21/2022: Platelets 209 10/28/2022: Hemoglobin 13.3 12/02/2022: BUN 36; Creatinine, Ser 1.47; NT-Pro BNP 3,640; Potassium 4.8; Sodium 138  Recent Lipid Panel    Component Value Date/Time   CHOL 127 04/21/2019 0917   TRIG 164 (H) 04/21/2019 0917   HDL 33 (L) 04/21/2019 0917   CHOLHDL 3.8 04/21/2019 0917   LDLCALC 66 04/21/2019 0917  Physical Exam:    VS:  BP (!) 100/50 (BP Location: Left Arm, Patient Position: Sitting)   Pulse 88   Ht 5\' 1"  (1.549 m)   Wt 156 lb (70.8 kg)   BMI 29.48 kg/m     Wt Readings from Last 3 Encounters:  01/06/23 156 lb (70.8 kg)  12/02/22 155 lb 12.8 oz (70.7 kg)  10/28/22 152 lb 6.4 oz (69.1 kg)     GEN:  Well nourished, well developed in no acute distress HEENT: Normal NECK: No JVD; No carotid bruits LYMPHATICS: No lymphadenopathy CARDIAC: RRR, no murmurs, no rubs, no gallops RESPIRATORY:  Clear to auscultation without rales, wheezing or rhonchi  ABDOMEN: Soft, non-tender, non-distended MUSCULOSKELETAL:  No edema; No deformity  SKIN: Warm and dry LOWER EXTREMITIES: no swelling NEUROLOGIC:  Alert and oriented x 3 PSYCHIATRIC:  Normal affect   ASSESSMENT:    1. Nonischemic cardiomyopathy (HCC)   2. Essential hypertension   3. Permanent atrial fibrillation (HCC)   4. History of pulmonary embolism    PLAN:    In order of problems listed above:  Nonischemic cardiomyopathy.  Seems to be compensated on physical exam.  Conservative palliative care.  Will continue. Essential hypertension blood pressure well-controlled continue present management. Atrial fibrillation which is permanent, rate is controlled.  Continue with Xarelto 15 mg doses appropriate for her kidney  function. History of pulmonary emboli continue with Xarelto. CPAP/sleep apnea.  Will send a message to our sleep team to see if anything can be done to adjust her equipment.  Actually her husband was asking about potentially Inspira device which I think should be a last resort in this clinical scenario   Medication Adjustments/Labs and Tests Ordered: Current medicines are reviewed at length with the patient today.  Concerns regarding medicines are outlined above.  No orders of the defined types were placed in this encounter.  Medication changes: No orders of the defined types were placed in this encounter.   Signed, Georgeanna Lea, MD, [facility] 01/06/2023 1:22 PM    [facility]

## 2023-01-06 NOTE — Patient Instructions (Signed)

## 2023-01-22 ENCOUNTER — Ambulatory Visit: Payer: PPO | Admitting: Cardiology

## 2023-01-26 NOTE — Addendum Note (Signed)
Addended by: Baldo Ash D on: 01/26/2023 12:34 PM   Modules accepted: Orders

## 2023-02-05 LAB — LAB REPORT - SCANNED: EGFR: 35

## 2023-02-06 ENCOUNTER — Telehealth: Payer: Self-pay | Admitting: Cardiology

## 2023-02-06 NOTE — Telephone Encounter (Signed)
Called patient and her husband reported that she had an episode of chest tightness on Tuesday. On Wednesday the chest tightness occurred off and on. She went to her PCP on Thursday and he started her on Metoprolol and increased her Furosemide. The increase of the Furosemide helped her sleep last night. Currently she has no chest pain, tightness or discomfort but she is short of breath which makes her anxious. They did not have any blood pressures for me to add to this note. Please advise.

## 2023-02-06 NOTE — Telephone Encounter (Signed)
New Message:     .Pt c/o Shortness Of Breath: STAT if SOB developed within the last 24 hours or pt is noticeably SOB on the phone  1. Are you currently SOB (can you hear that pt is SOB on the phone)? yes  2. How long have you been experiencing SOB?  Started Tuesday  3. Are you SOB when sitting or when up moving around?  both  4. Are you currently experiencing any other symptoms? She had chest pressure earlier this week- patient would like to be seen this afternoon if possible

## 2023-02-06 NOTE — Telephone Encounter (Signed)
Spoke with Mr. Rogalski per [facility] note that pt should be seen at Urgent Care or the ED for evaluation as he said she was short of breath and felt really bad. Spouse agreed and verbalized understanding. He had no further questions.

## 2023-02-08 DIAGNOSIS — I340 Nonrheumatic mitral (valve) insufficiency: Secondary | ICD-10-CM | POA: Diagnosis not present

## 2023-02-08 DIAGNOSIS — Z86711 Personal history of pulmonary embolism: Secondary | ICD-10-CM | POA: Diagnosis not present

## 2023-02-08 DIAGNOSIS — I10 Essential (primary) hypertension: Secondary | ICD-10-CM

## 2023-02-08 DIAGNOSIS — I509 Heart failure, unspecified: Secondary | ICD-10-CM | POA: Diagnosis not present

## 2023-02-08 DIAGNOSIS — N390 Urinary tract infection, site not specified: Secondary | ICD-10-CM | POA: Diagnosis not present

## 2023-02-08 DIAGNOSIS — I4891 Unspecified atrial fibrillation: Secondary | ICD-10-CM

## 2023-02-09 DIAGNOSIS — I340 Nonrheumatic mitral (valve) insufficiency: Secondary | ICD-10-CM | POA: Diagnosis not present

## 2023-02-09 DIAGNOSIS — I509 Heart failure, unspecified: Secondary | ICD-10-CM | POA: Diagnosis not present

## 2023-02-09 DIAGNOSIS — Z86711 Personal history of pulmonary embolism: Secondary | ICD-10-CM | POA: Diagnosis not present

## 2023-02-09 DIAGNOSIS — N390 Urinary tract infection, site not specified: Secondary | ICD-10-CM | POA: Diagnosis not present

## 2023-02-10 DIAGNOSIS — I340 Nonrheumatic mitral (valve) insufficiency: Secondary | ICD-10-CM | POA: Diagnosis not present

## 2023-02-10 DIAGNOSIS — N390 Urinary tract infection, site not specified: Secondary | ICD-10-CM | POA: Diagnosis not present

## 2023-02-10 DIAGNOSIS — I509 Heart failure, unspecified: Secondary | ICD-10-CM | POA: Diagnosis not present

## 2023-02-10 DIAGNOSIS — Z86711 Personal history of pulmonary embolism: Secondary | ICD-10-CM | POA: Diagnosis not present

## 2023-02-16 NOTE — Progress Notes (Unsigned)
Cardiology Office Note:  .   Date:  02/17/2023  ID:  Kerri Rogers, DOB [birth year], MRN [id number] PCP: Judge Stall, MD  [facility] Providers Cardiologist:  None    History of Present Illness: .   Kerri Rogers is a 88 y.o. female with a past medical history of pulmonary hypertension, NICM, paroxysmal atrial flutter, atrial fibrillation, severe MR, NSVT, COPD, GERD, overactive bladder, gout, CKD.  10/28/2022 EF 55 to 60%, LA severely dilated, RA mildly dilated, severe functional MR, TR with mild to moderate, moderate calcification of the aortic valve with mild regurgitation and sclerosis present 07/28/2022 cardiac catheterization at [facility], mild to moderate nonobstructive CAD, RHC numbers elevated  Evaluated by Dr. Bing Matter on 01/06/2023 she was doing well from a cardiac perspective, she did have home health coming by to wrap her legs and was doing well.   Most recently she was admitted to [facility] for heart failure exacerbation, UTI.  proBNP was elevated at 26,000.  Labs on day of discharge potassium 3.5, creatinine 1.6, sodium 141.  Evaluated by Dr. Bing Matter while she was hospitalized, she was started on Lasix 40 mg twice daily as well as 10 mEq of potassium.  She presents today accompanied by her husband for follow-up after recent hospitalization.  She states she is feeling much better however not quite back to her baseline.  Home health is still coming twice a week, but she feels like she is getting ready to be done with their services.  She continues to weigh herself every day and her weights have been stable.    ROS: Review of Systems  Constitutional: Negative.   HENT: Negative.    Eyes: Negative.   Respiratory: Negative.    Cardiovascular:  Positive for leg swelling.  Gastrointestinal: Negative.   Genitourinary: Negative.   Musculoskeletal: Negative.   Skin: Negative.   Neurological: Negative.   Endo/Heme/Allergies:  Bruises/bleeds easily.   Psychiatric/Behavioral: Negative.       Studies Reviewed: .        Cardiac Studies & Procedures       ECHOCARDIOGRAM  ECHOCARDIOGRAM COMPLETE 07/08/2022  Narrative ECHOCARDIOGRAM REPORT    Patient Name:   Kerri Rogers Date of Exam: 07/08/2022 Medical Rec #:  [id number]   Height:       61.0 in Accession #:    [id number]  Weight:       159.6 lb Date of Birth:  May 27, 1935   BSA:          1.716 m Patient Age:    87 years    BP:           112/68 mmHg Patient Gender: F           HR:           76 bpm. Exam Location:  [place]  Procedure: 2D Echo, Cardiac Doppler, Color Doppler and Strain Analysis  Indications:    Shortness of breath [R06.02 (ICD-10-CM)]; Congestive heart failure, unspecified HF chronicity, unspecified heart failure type (HCC) [I50.9 (ICD-10-CM)]  History:        Patient has prior history of Echocardiogram examinations, most recent 09/03/2021. CHF, COPD, Arrythmias:Atrial Flutter, Signs/Symptoms:Shortness of Breath; Risk Factors:Hypertension.  Sonographer:    Louie Boston RDCS Referring Phys: [phone] Marveen Reeks KRASOWSKI  IMPRESSIONS   1. Left ventricular ejection fraction, by estimation, is 55 to 60%. The left ventricle has normal function. The left ventricle has no regional wall motion abnormalities. There is mild left ventricular hypertrophy. Left ventricular diastolic parameters  are indeterminate. 2. Right ventricular systolic function is normal. The right ventricular size is normal. There is severely elevated pulmonary artery systolic pressure. 3. Left atrial size was severely dilated. 4. Right atrial size was moderately dilated. 5. The mitral valve is normal in structure. Moderate to severe mitral valve regurgitation. No evidence of mitral stenosis. Moderate mitral annular calcification. 6. Tricuspid valve regurgitation is moderate. 7. The aortic valve is normal in structure. Aortic valve regurgitation is mild. No aortic stenosis is present. 8. The inferior  vena cava is normal in size with greater than 50% respiratory variability, suggesting right atrial pressure of 3 mmHg.  FINDINGS Left Ventricle: Left ventricular ejection fraction, by estimation, is 55 to 60%. The left ventricle has normal function. The left ventricle has no regional wall motion abnormalities. The left ventricular internal cavity size was normal in size. There is mild left ventricular hypertrophy. Left ventricular diastolic parameters are indeterminate.  Right Ventricle: The right ventricular size is normal. No increase in right ventricular wall thickness. Right ventricular systolic function is normal. There is severely elevated pulmonary artery systolic pressure. The tricuspid regurgitant velocity is 3.59 m/s, and with an assumed right atrial pressure of 15 mmHg, the estimated right ventricular systolic pressure is 66.6 mmHg.  Left [facility]: Left atrial size was severely dilated.  Right [facility]: Right atrial size was moderately dilated.  Pericardium: There is no evidence of pericardial effusion.  Mitral Valve: The mitral valve is normal in structure. Moderate mitral annular calcification. Moderate to severe mitral valve regurgitation. No evidence of mitral valve stenosis.  Tricuspid Valve: The tricuspid valve is normal in structure. Tricuspid valve regurgitation is moderate . No evidence of tricuspid stenosis.  Aortic Valve: The aortic valve is normal in structure. Aortic valve regurgitation is mild. No aortic stenosis is present.  Pulmonic Valve: The pulmonic valve was normal in structure. Pulmonic valve regurgitation is not visualized. No evidence of pulmonic stenosis.  Aorta: The aortic root is normal in size and structure.  Venous: The inferior vena cava is normal in size with greater than 50% respiratory variability, suggesting right atrial pressure of 3 mmHg.  IAS/Shunts: No atrial level shunt detected by color flow Doppler.   LEFT VENTRICLE PLAX 2D LVIDd:          5.30 cm     Diastology LVIDs:         4.40 cm     LV e' medial:    3.15 cm/s LV PW:         1.40 cm     LV E/e' medial:  48.3 LV IVS:        1.40 cm     LV e' lateral:   4.50 cm/s LVOT diam:     2.00 cm     LV E/e' lateral: 33.8 LV SV:         47 LV SV Index:   27 LVOT Area:     3.14 cm  LV Volumes (MOD) LV vol d, MOD A2C: 93.2 ml LV vol d, MOD A4C: 91.1 ml LV vol s, MOD A2C: 56.6 ml LV vol s, MOD A4C: 52.4 ml LV SV MOD A2C:     36.6 ml LV SV MOD A4C:     91.1 ml LV SV MOD BP:      39.3 ml  RIGHT VENTRICLE            IVC RV S prime:     5.35 cm/s  IVC diam: 2.50 cm TAPSE (M-mode): 1.4 cm  LEFT ATRIUM              Index        RIGHT ATRIUM           Index LA diam:        5.70 cm  3.32 cm/m   RA Area:     25.90 cm LA Vol (A2C):   95.9 ml  55.88 ml/m  RA Volume:   87.90 ml  51.22 ml/m LA Vol (A4C):   106.0 ml 61.77 ml/m LA Biplane Vol: 104.0 ml 60.60 ml/m AORTIC VALVE LVOT Vmax:   68.50 cm/s LVOT Vmean:  51.600 cm/s LVOT VTI:    0.150 m  AORTA Ao Root diam: 3.00 cm Ao Asc diam:  3.60 cm Ao Desc diam: 2.30 cm  MITRAL VALVE                  TRICUSPID VALVE MV Area (PHT): 3.54 cm       TR Peak grad:   51.6 mmHg MV Decel Time: 214 msec       TR Vmax:        359.00 cm/s MR Peak grad:    114.1 mmHg MR Mean grad:    75.0 mmHg    SHUNTS MR Vmax:         534.00 cm/s  Systemic VTI:  0.15 m MR Vmean:        408.0 cm/s   Systemic Diam: 2.00 cm MR PISA:         2.26 cm MR PISA Eff ROA: 16 mm MR PISA Radius:  0.60 cm MV E velocity: 152.00 cm/s  Belva Crome MD Electronically signed by Belva Crome MD Signature Date/Time: 07/08/2022/5:36:23 PM    Final   TEE  ECHO TEE 10/28/2022  Narrative TRANSESOPHOGEAL ECHO REPORT    Patient Name:   KHIANNA BROADWELL Date of Exam: 10/28/2022 Medical Rec #:  [id number]   Height:       61.0 in Accession #:    [id number]  Weight:       152.4 lb Date of Birth:  02/24/1935   BSA:          1.683 m Patient Age:    87 years    BP:            159/85 mmHg Patient Gender: F           HR:           110 bpm. Exam Location:  Inpatient  Procedure: 2D Echo, 3D Echo, Cardiac Doppler and Color Doppler  Indications:    Severe mitral regurgitation  History:        Patient has prior history of Echocardiogram examinations, most recent 07/08/2022. CHF, COPD, Arrythmias:Atrial Fibrillation and Atrial Flutter, Signs/Symptoms:Shortness of Breath; Risk Factors:Hypertension.  Sonographer:    Milda Smart Referring Phys: [id number] Wendall Stade  PROCEDURE: After discussion of the risks and benefits of a TEE, an informed consent was obtained from the patient. TEE procedure time was 19 minutes. The transesophogeal probe was passed without difficulty through the esophogus of the patient. Imaged were obtained with the patient in a left lateral decubitus position. Sedation performed by different physician. The patient was monitored while under deep sedation. Anesthestetic sedation was provided intravenously by Anesthesiology: 208.95mg  of Propofol. Image quality was good. The patient's vital signs; including heart rate, blood pressure, and oxygen saturation; remained stable throughout the procedure. The patient developed no complications during the procedure.  IMPRESSIONS   1. Left  ventricular ejection fraction, by estimation, is 55 to 60%. The left ventricle has normal function. 2. Right ventricular systolic function is normal. The right ventricular size is normal. 3. Left atrial size was severely dilated. No left atrial/left atrial appendage thrombus was detected. 4. Right atrial size was mildly dilated. 5. Severe functional MR atrial FMR and tethered posterior leaflet MR is medial to A2/P2 with large area of calcium on anterior leaflet and retracted short posterior leaflet with sub chordal calcification Not ideal for mitral clip. The mitral valve is abnormal. Severe mitral valve regurgitation. 6. Tricuspid valve regurgitation is mild to  moderate. 7. The aortic valve is tricuspid. There is moderate calcification of the aortic valve. Aortic valve regurgitation is mild. Aortic valve sclerosis is present, with no evidence of aortic valve stenosis.  FINDINGS Left Ventricle: Left ventricular ejection fraction, by estimation, is 55 to 60%. The left ventricle has normal function. The left ventricular internal cavity size was normal in size.  Right Ventricle: The right ventricular size is normal. Right vetricular wall thickness was not assessed. Right ventricular systolic function is normal.  Left [facility]: Left atrial size was severely dilated. No left atrial/left atrial appendage thrombus was detected.  Right [facility]: Right atrial size was mildly dilated.  Pericardium: There is no evidence of pericardial effusion.  Mitral Valve: Severe functional MR atrial FMR and tethered posterior leaflet MR is medial to A2/P2 with large area of calcium on anterior leaflet and retracted short posterior leaflet with sub chordal calcification Not ideal for mitral clip. The mitral valve is abnormal. Severe mitral valve regurgitation. MV peak gradient, 9.5 mmHg. The mean mitral valve gradient is 3.0 mmHg.  Tricuspid Valve: The tricuspid valve is normal in structure. Tricuspid valve regurgitation is mild to moderate.  Aortic Valve: The aortic valve is tricuspid. There is moderate calcification of the aortic valve. Aortic valve regurgitation is mild. Aortic valve sclerosis is present, with no evidence of aortic valve stenosis.  Pulmonic Valve: The pulmonic valve was normal in structure. Pulmonic valve regurgitation is mild.  Aorta: The aortic root is normal in size and structure.  IAS/Shunts: No atrial level shunt detected by color flow Doppler.  Additional Comments: Spectral Doppler performed.  MITRAL VALVE MV Peak grad: 9.5 mmHg MV Mean grad: 3.0 mmHg MV Vmax:      1.54 m/s MV Vmean:     75.7 cm/s MR Peak grad:    143.0 mmHg MR Mean grad:     98.0 mmHg MR Vmax:         598.00 cm/s MR Vmean:        467.0 cm/s MR PISA:         1.57 cm MR PISA Eff ROA: 10 mm MR PISA Radius:  0.50 cm  Charlton Haws MD Electronically signed by Charlton Haws MD Signature Date/Time: 10/28/2022/10:04:51 AM    Final            Risk Assessment/Calculations:    CHA2DS2-VASc Score = 5   This indicates a 7.2% annual risk of stroke. The patient's score is based upon: CHF History: 1 HTN History: 1 Diabetes History: 0 Stroke History: 0 Vascular Disease History: 0 Age Score: 2 Gender Score: 1            Physical Exam:   VS:  BP 120/80 (BP Location: Right Arm, Patient Position: Sitting, Cuff Size: Normal)   Pulse 74   Ht 5\' 1"  (1.549 m)   Wt 155 lb (70.3 kg)   SpO2 97%  BMI 29.29 kg/m    Wt Readings from Last 3 Encounters:  02/17/23 155 lb (70.3 kg)  01/06/23 156 lb (70.8 kg)  12/02/22 155 lb 12.8 oz (70.7 kg)    GEN: Well nourished, well developed in no acute distress NECK: No JVD; No carotid bruits CARDIAC: irregularly irregular, no murmurs, rubs, gallops RESPIRATORY:  Clear to auscultation without rales, wheezing or rhonchi  ABDOMEN: Soft, non-tender, non-distended EXTREMITIES:  No edema; No deformity   ASSESSMENT AND PLAN: .   CAD-nonobstructive per [facility] in 2023; continue nitroglycerin as needed, Stable with no anginal symptoms. No indication for ischemic evaluation.    HFpEF - most recent echo 60-65%, NYHA class I-II today, euvolemic, continue Farxiga 5 mg daily, continue Entresto 24-26 mg twice daily, metoprolol has been discontinued by her PCP.  She is weighing herself daily and her weight has not fluctuated more than 3 pounds in either direction. Further GDMT prohibited by kidney dysfunction.   PAF/hypercoagulable state-CHA2DS2-VASc score of 5, heart rate is controlled today at 74 bpm, denies hematochezia, hematuria, hemoptysis.  Continue Xarelto 15 mg daily--creatinine clearance 30 dose reduction indicated, continue digoxin  0.125 mg daily, continue metoprolol 25 mg twice daily.  Severe MR - severe functional MR per echo 10/28/22, repeat echo in 6-12 months.  CKD - will see nephrologist tomorrow, they plan to repeat lab work and they will request that it is sent to our office as well.       Dispo: follow up in 6-8 weeks  Signed, Flossie Dibble, NP

## 2023-02-17 ENCOUNTER — Encounter: Payer: Self-pay | Admitting: Cardiology

## 2023-02-17 ENCOUNTER — Ambulatory Visit: Payer: PPO | Attending: Cardiology | Admitting: Cardiology

## 2023-02-17 VITALS — BP 120/80 | HR 74 | Ht 61.0 in | Wt 155.0 lb

## 2023-02-17 DIAGNOSIS — I4892 Unspecified atrial flutter: Secondary | ICD-10-CM

## 2023-02-17 DIAGNOSIS — I2510 Atherosclerotic heart disease of native coronary artery without angina pectoris: Secondary | ICD-10-CM

## 2023-02-17 DIAGNOSIS — I428 Other cardiomyopathies: Secondary | ICD-10-CM | POA: Diagnosis not present

## 2023-02-17 DIAGNOSIS — I4821 Permanent atrial fibrillation: Secondary | ICD-10-CM

## 2023-02-17 DIAGNOSIS — D6859 Other primary thrombophilia: Secondary | ICD-10-CM

## 2023-02-17 DIAGNOSIS — I272 Pulmonary hypertension, unspecified: Secondary | ICD-10-CM

## 2023-02-17 DIAGNOSIS — I5030 Unspecified diastolic (congestive) heart failure: Secondary | ICD-10-CM

## 2023-02-17 LAB — LAB REPORT - SCANNED: EGFR: 27

## 2023-02-17 NOTE — Patient Instructions (Signed)
Medication Instructions:  Your physician recommends that you continue on your current medications as directed. Please refer to the Current Medication list given to you today.  *If you need a refill on your cardiac medications before your next appointment, please call your pharmacy*   Lab Work: None ordered If you have labs (blood work) drawn today and your tests are completely normal, you will receive your results only by: MyChart Message (if you have MyChart) OR A paper copy in the mail If you have any lab test that is abnormal or we need to change your treatment, we will call you to review the results.   Testing/Procedures: None ordered   Follow-Up: At [facility], you and your health needs are our priority.  As part of our continuing mission to provide you with exceptional heart care, we have created designated Provider Care Teams.  These Care Teams include your primary Cardiologist (physician) and Advanced Practice Providers (APPs -  Physician Assistants and Nurse Practitioners) who all work together to provide you with the care you need, when you need it.  We recommend signing up for the patient portal called "MyChart".  Sign up information is provided on this After Visit Summary.  MyChart is used to connect with patients for Virtual Visits (Telemedicine).  Patients are able to view lab/test results, encounter notes, upcoming appointments, etc.  Non-urgent messages can be sent to your provider as well.   To learn more about what you can do with MyChart, go to [email or web address].    Your next appointment:   6-8 week(s)  The format for your next appointment:   In Person  Provider:   Gypsy Balsam, MD    Other Instructions none  Important Information About Sugar

## 2023-03-11 ENCOUNTER — Ambulatory Visit: Payer: PPO | Attending: Cardiology | Admitting: Cardiology

## 2023-03-11 ENCOUNTER — Encounter: Payer: Self-pay | Admitting: Cardiology

## 2023-03-11 VITALS — BP 110/80 | HR 73 | Ht 61.0 in | Wt 150.0 lb

## 2023-03-11 DIAGNOSIS — D6859 Other primary thrombophilia: Secondary | ICD-10-CM

## 2023-03-11 DIAGNOSIS — I428 Other cardiomyopathies: Secondary | ICD-10-CM

## 2023-03-11 DIAGNOSIS — R0602 Shortness of breath: Secondary | ICD-10-CM

## 2023-03-11 DIAGNOSIS — Z79899 Other long term (current) drug therapy: Secondary | ICD-10-CM

## 2023-03-11 DIAGNOSIS — I2510 Atherosclerotic heart disease of native coronary artery without angina pectoris: Secondary | ICD-10-CM

## 2023-03-11 DIAGNOSIS — I272 Pulmonary hypertension, unspecified: Secondary | ICD-10-CM

## 2023-03-11 DIAGNOSIS — I10 Essential (primary) hypertension: Secondary | ICD-10-CM

## 2023-03-11 DIAGNOSIS — R0600 Dyspnea, unspecified: Secondary | ICD-10-CM

## 2023-03-11 NOTE — Patient Instructions (Addendum)
Medication Instructions:  Your physician has recommended you make the following change in your medication:  Add an extra 40 mg of Lasix in the morning for 3 days and then back to 40 mg two times daily Do not take Digoxin the morning of 8/13 when come to see Wallis Bamberg, NP  *If you need a refill on your cardiac medications before your next appointment, please call your pharmacy*   Lab Work: Your physician recommends that you return for lab work in: Today for BMP, CBC, ProBNP  If you have labs (blood work) drawn today and your tests are completely normal, you will receive your results only by: MyChart Message (if you have MyChart) OR A paper copy in the mail If you have any lab test that is abnormal or we need to change your treatment, we will call you to review the results.   Testing/Procedures: NONE   Follow-Up: At [facility], you and your health needs are our priority.  As part of our continuing mission to provide you with exceptional heart care, we have created designated Provider Care Teams.  These Care Teams include your primary Cardiologist (physician) and Advanced Practice Providers (APPs -  Physician Assistants and Nurse Practitioners) who all work together to provide you with the care you need, when you need it.  We recommend signing up for the patient portal called "MyChart".  Sign up information is provided on this After Visit Summary.  MyChart is used to connect with patients for Virtual Visits (Telemedicine).  Patients are able to view lab/test results, encounter notes, upcoming appointments, etc.  Non-urgent messages can be sent to your provider as well.   To learn more about what you can do with MyChart, go to [email or web address].    Your next appointment:  August 13th at 9:15    Provider:   Wallis Bamberg, NP Rosalita Levan)    Other Instructions Continue to limit Salt and fluid intake

## 2023-03-11 NOTE — Progress Notes (Signed)
Cardiology Office Note:  .   Date:  03/11/2023  ID:  Kerri Rogers, DOB 1935-02-11, MRN [id number] PCP: Judge Stall, MD  [facility] Providers Cardiologist:  None    History of Present Illness: .    Kerri Rogers is a 88 y.o. female with a past medical history of pulmonary hypertension, NICM, paroxysmal atrial flutter, atrial fibrillation, severe MR, NSVT, COPD, GERD, overactive bladder, gout, CKD.  10/28/2022 EF 55 to 60%, LA severely dilated, RA mildly dilated, severe functional MR, TR with mild to moderate, moderate calcification of the aortic valve with mild regurgitation and sclerosis present 07/28/2022 cardiac catheterization at [facility], mild to moderate nonobstructive CAD, RHC numbers elevated  Evaluated by Dr. Bing Matter on 01/06/2023 she was doing well from a cardiac perspective, she did have home health coming by to wrap her legs and was doing well.   Most recently she was admitted to [facility] for heart failure exacerbation, UTI.  proBNP was elevated at 26,000.  Labs on day of discharge potassium 3.5, creatinine 1.6, sodium 141.  Evaluated by Dr. Bing Matter while she was hospitalized, she was started on Lasix 40 mg twice daily as well as 10 mEq of potassium.  She presents today accompanied by her husband for evaluation of worsening DOE over the last week.  Her husband feels like she needs oxygen at home, and presents today hoping this can be accomplished.  They state that she seems winded walking around in their household.  She does sleep in a recliner at night however this been persistent for some time.  She is weighing daily, her weights have been stable.  PT/OT is still coming approximately once a week and her wrapping her legs.  Her oxygen saturation is good in the office today. She denies chest pain, palpitations, pnd, orthopnea, n, v, dizziness, syncope, edema, weight gain, or early satiety.    ROS: Review of Systems  Constitutional: Negative.   HENT:  Negative.    Eyes: Negative.   Respiratory: Negative.    Cardiovascular:  Positive for leg swelling.  Gastrointestinal: Negative.   Genitourinary: Negative.   Musculoskeletal: Negative.   Skin: Negative.   Neurological: Negative.   Endo/Heme/Allergies:  Bruises/bleeds easily.  Psychiatric/Behavioral: Negative.       Studies Reviewed: .        Cardiac Studies & Procedures       ECHOCARDIOGRAM  ECHOCARDIOGRAM COMPLETE 07/08/2022  Narrative ECHOCARDIOGRAM REPORT    Patient Name:   Kerri Rogers Date of Exam: 07/08/2022 Medical Rec #:  [id number]   Height:       61.0 in Accession #:    [id number]  Weight:       159.6 lb Date of Birth:  30-Mar-1935   BSA:          1.716 m Patient Age:    87 years    BP:           112/68 mmHg Patient Gender: F           HR:           76 bpm. Exam Location:  [place]  Procedure: 2D Echo, Cardiac Doppler, Color Doppler and Strain Analysis  Indications:    Shortness of breath [R06.02 (ICD-10-CM)]; Congestive heart failure, unspecified HF chronicity, unspecified heart failure type (HCC) [I50.9 (ICD-10-CM)]  History:        Patient has prior history of Echocardiogram examinations, most recent 09/03/2021. CHF, COPD, Arrythmias:Atrial Flutter, Signs/Symptoms:Shortness of Breath; Risk Factors:Hypertension.  Sonographer:  Louie Boston RDCS Referring Phys: [id number] Marveen Reeks KRASOWSKI  IMPRESSIONS   1. Left ventricular ejection fraction, by estimation, is 55 to 60%. The left ventricle has normal function. The left ventricle has no regional wall motion abnormalities. There is mild left ventricular hypertrophy. Left ventricular diastolic parameters are indeterminate. 2. Right ventricular systolic function is normal. The right ventricular size is normal. There is severely elevated pulmonary artery systolic pressure. 3. Left atrial size was severely dilated. 4. Right atrial size was moderately dilated. 5. The mitral valve is normal in structure. Moderate  to severe mitral valve regurgitation. No evidence of mitral stenosis. Moderate mitral annular calcification. 6. Tricuspid valve regurgitation is moderate. 7. The aortic valve is normal in structure. Aortic valve regurgitation is mild. No aortic stenosis is present. 8. The inferior vena cava is normal in size with greater than 50% respiratory variability, suggesting right atrial pressure of 3 mmHg.  FINDINGS Left Ventricle: Left ventricular ejection fraction, by estimation, is 55 to 60%. The left ventricle has normal function. The left ventricle has no regional wall motion abnormalities. The left ventricular internal cavity size was normal in size. There is mild left ventricular hypertrophy. Left ventricular diastolic parameters are indeterminate.  Right Ventricle: The right ventricular size is normal. No increase in right ventricular wall thickness. Right ventricular systolic function is normal. There is severely elevated pulmonary artery systolic pressure. The tricuspid regurgitant velocity is 3.59 m/s, and with an assumed right atrial pressure of 15 mmHg, the estimated right ventricular systolic pressure is 66.6 mmHg.  Left [facility]: Left atrial size was severely dilated.  Right [facility]: Right atrial size was moderately dilated.  Pericardium: There is no evidence of pericardial effusion.  Mitral Valve: The mitral valve is normal in structure. Moderate mitral annular calcification. Moderate to severe mitral valve regurgitation. No evidence of mitral valve stenosis.  Tricuspid Valve: The tricuspid valve is normal in structure. Tricuspid valve regurgitation is moderate . No evidence of tricuspid stenosis.  Aortic Valve: The aortic valve is normal in structure. Aortic valve regurgitation is mild. No aortic stenosis is present.  Pulmonic Valve: The pulmonic valve was normal in structure. Pulmonic valve regurgitation is not visualized. No evidence of pulmonic stenosis.  Aorta: The aortic root is  normal in size and structure.  Venous: The inferior vena cava is normal in size with greater than 50% respiratory variability, suggesting right atrial pressure of 3 mmHg.  IAS/Shunts: No atrial level shunt detected by color flow Doppler.   LEFT VENTRICLE PLAX 2D LVIDd:         5.30 cm     Diastology LVIDs:         4.40 cm     LV e' medial:    3.15 cm/s LV PW:         1.40 cm     LV E/e' medial:  48.3 LV IVS:        1.40 cm     LV e' lateral:   4.50 cm/s LVOT diam:     2.00 cm     LV E/e' lateral: 33.8 LV SV:         47 LV SV Index:   27 LVOT Area:     3.14 cm  LV Volumes (MOD) LV vol d, MOD A2C: 93.2 ml LV vol d, MOD A4C: 91.1 ml LV vol s, MOD A2C: 56.6 ml LV vol s, MOD A4C: 52.4 ml LV SV MOD A2C:     36.6 ml LV SV MOD A4C:  91.1 ml LV SV MOD BP:      39.3 ml  RIGHT VENTRICLE            IVC RV S prime:     5.35 cm/s  IVC diam: 2.50 cm TAPSE (M-mode): 1.4 cm  LEFT ATRIUM              Index        RIGHT ATRIUM           Index LA diam:        5.70 cm  3.32 cm/m   RA Area:     25.90 cm LA Vol (A2C):   95.9 ml  55.88 ml/m  RA Volume:   87.90 ml  51.22 ml/m LA Vol (A4C):   106.0 ml 61.77 ml/m LA Biplane Vol: 104.0 ml 60.60 ml/m AORTIC VALVE LVOT Vmax:   68.50 cm/s LVOT Vmean:  51.600 cm/s LVOT VTI:    0.150 m  AORTA Ao Root diam: 3.00 cm Ao Asc diam:  3.60 cm Ao Desc diam: 2.30 cm  MITRAL VALVE                  TRICUSPID VALVE MV Area (PHT): 3.54 cm       TR Peak grad:   51.6 mmHg MV Decel Time: 214 msec       TR Vmax:        359.00 cm/s MR Peak grad:    114.1 mmHg MR Mean grad:    75.0 mmHg    SHUNTS MR Vmax:         534.00 cm/s  Systemic VTI:  0.15 m MR Vmean:        408.0 cm/s   Systemic Diam: 2.00 cm MR PISA:         2.26 cm MR PISA Eff ROA: 16 mm MR PISA Radius:  0.60 cm MV E velocity: 152.00 cm/s  Belva Crome MD Electronically signed by Belva Crome MD Signature Date/Time: 07/08/2022/5:36:23 PM    Final   TEE  ECHO TEE  10/28/2022  Narrative TRANSESOPHOGEAL ECHO REPORT    Patient Name:   Kerri Rogers Date of Exam: 10/28/2022 Medical Rec #:  [id number]   Height:       61.0 in Accession #:    [id number]  Weight:       152.4 lb Date of Birth:  [birth year]   BSA:          1.683 m Patient Age:    87 years    BP:           159/85 mmHg Patient Gender: F           HR:           110 bpm. Exam Location:  Inpatient  Procedure: 2D Echo, 3D Echo, Cardiac Doppler and Color Doppler  Indications:    Severe mitral regurgitation  History:        Patient has prior history of Echocardiogram examinations, most recent 07/08/2022. CHF, COPD, Arrythmias:Atrial Fibrillation and Atrial Flutter, Signs/Symptoms:Shortness of Breath; Risk Factors:Hypertension.  Sonographer:    Milda Smart Referring Phys: [id number] Wendall Stade  PROCEDURE: After discussion of the risks and benefits of a TEE, an informed consent was obtained from the patient. TEE procedure time was 19 minutes. The transesophogeal probe was passed without difficulty through the esophogus of the patient. Imaged were obtained with the patient in a left lateral decubitus position. Sedation performed by different physician. The patient was monitored while under  deep sedation. Anesthestetic sedation was provided intravenously by Anesthesiology: 208.95mg  of Propofol. Image quality was good. The patient's vital signs; including heart rate, blood pressure, and oxygen saturation; remained stable throughout the procedure. The patient developed no complications during the procedure.  IMPRESSIONS   1. Left ventricular ejection fraction, by estimation, is 55 to 60%. The left ventricle has normal function. 2. Right ventricular systolic function is normal. The right ventricular size is normal. 3. Left atrial size was severely dilated. No left atrial/left atrial appendage thrombus was detected. 4. Right atrial size was mildly dilated. 5. Severe functional MR atrial FMR and  tethered posterior leaflet MR is medial to A2/P2 with large area of calcium on anterior leaflet and retracted short posterior leaflet with sub chordal calcification Not ideal for mitral clip. The mitral valve is abnormal. Severe mitral valve regurgitation. 6. Tricuspid valve regurgitation is mild to moderate. 7. The aortic valve is tricuspid. There is moderate calcification of the aortic valve. Aortic valve regurgitation is mild. Aortic valve sclerosis is present, with no evidence of aortic valve stenosis.  FINDINGS Left Ventricle: Left ventricular ejection fraction, by estimation, is 55 to 60%. The left ventricle has normal function. The left ventricular internal cavity size was normal in size.  Right Ventricle: The right ventricular size is normal. Right vetricular wall thickness was not assessed. Right ventricular systolic function is normal.  Left [facility]: Left atrial size was severely dilated. No left atrial/left atrial appendage thrombus was detected.  Right [facility]: Right atrial size was mildly dilated.  Pericardium: There is no evidence of pericardial effusion.  Mitral Valve: Severe functional MR atrial FMR and tethered posterior leaflet MR is medial to A2/P2 with large area of calcium on anterior leaflet and retracted short posterior leaflet with sub chordal calcification Not ideal for mitral clip. The mitral valve is abnormal. Severe mitral valve regurgitation. MV peak gradient, 9.5 mmHg. The mean mitral valve gradient is 3.0 mmHg.  Tricuspid Valve: The tricuspid valve is normal in structure. Tricuspid valve regurgitation is mild to moderate.  Aortic Valve: The aortic valve is tricuspid. There is moderate calcification of the aortic valve. Aortic valve regurgitation is mild. Aortic valve sclerosis is present, with no evidence of aortic valve stenosis.  Pulmonic Valve: The pulmonic valve was normal in structure. Pulmonic valve regurgitation is mild.  Aorta: The aortic root is normal  in size and structure.  IAS/Shunts: No atrial level shunt detected by color flow Doppler.  Additional Comments: Spectral Doppler performed.  MITRAL VALVE MV Peak grad: 9.5 mmHg MV Mean grad: 3.0 mmHg MV Vmax:      1.54 m/s MV Vmean:     75.7 cm/s MR Peak grad:    143.0 mmHg MR Mean grad:    98.0 mmHg MR Vmax:         598.00 cm/s MR Vmean:        467.0 cm/s MR PISA:         1.57 cm MR PISA Eff ROA: 10 mm MR PISA Radius:  0.50 cm  Charlton Haws MD Electronically signed by Charlton Haws MD Signature Date/Time: 10/28/2022/10:04:51 AM    Final            Risk Assessment/Calculations:    CHA2DS2-VASc Score = 5   This indicates a 7.2% annual risk of stroke. The patient's score is based upon: CHF History: 1 HTN History: 1 Diabetes History: 0 Stroke History: 0 Vascular Disease History: 0 Age Score: 2 Gender Score: 1  Physical Exam:   VS:  BP 110/80 (BP Location: Right Arm, Patient Position: Sitting, Cuff Size: Normal)   Pulse 73   Ht 5\' 1"  (1.549 m)   Wt 150 lb (68 kg)   SpO2 96%   BMI 28.34 kg/m    Wt Readings from Last 3 Encounters:  03/11/23 150 lb (68 kg)  02/17/23 155 lb (70.3 kg)  01/06/23 156 lb (70.8 kg)    GEN: Well nourished, well developed in no acute distress NECK: No JVD; No carotid bruits CARDIAC: irregularly irregular, no murmurs, rubs, gallops RESPIRATORY:  Clear to auscultation without rales, wheezing or rhonchi  ABDOMEN: Soft, non-tender, non-distended EXTREMITIES:  No edema; No deformity   ASSESSMENT AND PLAN: .   CAD-nonobstructive per [facility] in 2023; continue nitroglycerin as needed, Stable with no anginal symptoms. No indication for ischemic evaluation.    HFpEF/SOB - most recent echo 60-65%, NYHA class III today, volume overloaded, bilateral rales appreciated,  continue Farxiga 5 mg daily (started per nephrology), continue Entresto 24-26 mg twice daily, metoprolol 25 mg twice daily.  We will increase her lasix to 80 mg daily in am  x 3 days. Will repeat BMET today, as well as proBNP. Further GDMT prohibited by kidney dysfunction. Spouse would like for her to have O2 at home, feels it helped when they had it in the past. Will forward to her PCP so they are aware that they want to be evaluated for this. May cause some comfort to them, but not sure she will qualify for it and I explained this as well.   PAF/hypercoagulable state- CHA2DS2-VASc score of 5, heart rate is controlled today at 73 bpm, denies hematochezia, hematuria, hemoptysis.  Continue Xarelto 15 mg daily--creatinine clearance 30 dose reduction indicated, continue digoxin 0.125 mg daily, continue metoprolol 25 mg twice daily. Reepat CBC, BMET.   Severe MR - severe functional MR per echo 10/28/22, repeat echo in 6-12 months.  CKD - followed by nephrology, was started on Farxiga 5 mg by them.        Dispo: Labs, Return in 2 weeks will need repeat labs (they are going out of town on Friday).   Signed, Flossie Dibble, NP

## 2023-03-12 ENCOUNTER — Telehealth: Payer: Self-pay | Admitting: *Deleted

## 2023-03-12 DIAGNOSIS — R0609 Other forms of dyspnea: Secondary | ICD-10-CM

## 2023-03-12 LAB — BASIC METABOLIC PANEL WITH GFR
BUN/Creatinine Ratio: 27 (ref 12–28)
BUN: 45 mg/dL — ABNORMAL HIGH (ref 8–27)
CO2: 25 mmol/L (ref 20–29)
Calcium: 10.3 mg/dL (ref 8.7–10.3)
Chloride: 105 mmol/L (ref 96–106)
Creatinine, Ser: 1.65 mg/dL — ABNORMAL HIGH (ref 0.57–1.00)
Glucose: 90 mg/dL (ref 70–99)
Potassium: 3.7 mmol/L (ref 3.5–5.2)
Sodium: 144 mmol/L (ref 134–144)
eGFR: 30 mL/min/1.73 — ABNORMAL LOW

## 2023-03-12 LAB — CBC WITH DIFFERENTIAL/PLATELET
Basophils Absolute: 0 x10E3/uL (ref 0.0–0.2)
Basos: 1 %
EOS (ABSOLUTE): 0.2 x10E3/uL (ref 0.0–0.4)
Eos: 2 %
Hematocrit: 43.4 % (ref 34.0–46.6)
Hemoglobin: 13.8 g/dL (ref 11.1–15.9)
Immature Grans (Abs): 0 x10E3/uL (ref 0.0–0.1)
Immature Granulocytes: 0 %
Lymphocytes Absolute: 1.8 x10E3/uL (ref 0.7–3.1)
Lymphs: 22 %
MCH: 30.1 pg (ref 26.6–33.0)
MCHC: 31.8 g/dL (ref 31.5–35.7)
MCV: 95 fL (ref 79–97)
Monocytes Absolute: 0.5 x10E3/uL (ref 0.1–0.9)
Monocytes: 6 %
Neutrophils Absolute: 5.4 x10E3/uL (ref 1.4–7.0)
Neutrophils: 69 %
Platelets: 194 x10E3/uL (ref 150–450)
RBC: 4.59 x10E6/uL (ref 3.77–5.28)
RDW: 12.4 % (ref 11.7–15.4)
WBC: 7.9 x10E3/uL (ref 3.4–10.8)

## 2023-03-12 LAB — PRO B NATRIURETIC PEPTIDE: NT-Pro BNP: 11734 pg/mL — ABNORMAL HIGH (ref 0–738)

## 2023-03-12 MED ORDER — BUMETANIDE 1 MG PO TABS: 1.0000 mg | ORAL_TABLET | Freq: Two times a day (BID) | ORAL | 1 refills | Status: DC

## 2023-03-12 NOTE — Telephone Encounter (Signed)
-----   Message from Flossie Dibble sent at 03/12/2023  7:55 AM EDT ----- Stable kidney results.  You are definitely holding onto too much fluid. This is definitely why she is feeling SOB when she is walking. I want to try a different fluid pill. Start Bumex 1 mg twice daily. If you already took the double dose of lasix this morning, that is fine, pick up the Bumex and take that as the second dose for the day.  Friday - take (2) 1 mg bumex in the am, (1) 1 mg bumex in afternoon Saturday - take (2) 1 mg bumex in the am, (1) 1 mg bumex in afternoon Sunday - go to 1 mg bumex in am and 1 mg in the pm We will try this Bumex x 1 week.   Return when you get back from out of town and come by and let us recheck your BMET.

## 2023-03-12 NOTE — Telephone Encounter (Signed)
Spoke with Pt's husband, Vonna Kotyk. Instructed him about the medication instructions and went over them extensively. Husband verbalized understanding and had no further questions.

## 2023-03-13 ENCOUNTER — Telehealth: Payer: Self-pay | Admitting: Cardiology

## 2023-03-13 NOTE — Telephone Encounter (Signed)
Left vm for Carollee Herter to callback.

## 2023-03-13 NOTE — Telephone Encounter (Signed)
Carollee Herter from [facility] is calling to follow up on the patient's lab results. Carollee Herter requested for [place] to call her and we can leave a detail vm if she does not answer. Patient will be going out of town tomorrow. Please advise.

## 2023-03-13 NOTE — Telephone Encounter (Signed)
Results reviewed with [facility] home health as per Wallis Bamberg PA's note. Victorino Dike verbalized understanding and had no additional questions.

## 2023-03-24 ENCOUNTER — Telehealth: Payer: Self-pay | Admitting: Cardiology

## 2023-03-24 LAB — BASIC METABOLIC PANEL WITH GFR
BUN/Creatinine Ratio: 27 (ref 12–28)
BUN: 49 mg/dL — ABNORMAL HIGH (ref 8–27)
CO2: 27 mmol/L (ref 20–29)
Calcium: 10.4 mg/dL — ABNORMAL HIGH (ref 8.7–10.3)
Chloride: 102 mmol/L (ref 96–106)
Creatinine, Ser: 1.83 mg/dL — ABNORMAL HIGH (ref 0.57–1.00)
Glucose: 71 mg/dL (ref 70–99)
Potassium: 4.1 mmol/L (ref 3.5–5.2)
Sodium: 142 mmol/L (ref 134–144)
eGFR: 27 mL/min/1.73 — ABNORMAL LOW

## 2023-03-24 NOTE — Telephone Encounter (Signed)
  The patient's husband called and is requesting that patient's lab results be sent to her nephrologist, Dr. Matthew Folks. He provided the fax number: [phone].

## 2023-03-24 NOTE — Telephone Encounter (Signed)
Labs routed to Dr. Matthew Folks as requested by pt.

## 2023-03-30 NOTE — Progress Notes (Signed)
 " Cardiology Office Note:  .   Date:  03/31/2023  ID:  Kerri Rogers, DOB 19-Jan-1935, MRN [id number] PCP: Lennie Boom, MD  [facility] Providers Cardiologist:  Lamar Fitch, MD    History of Present Illness: .    Kerri Rogers is a 88 y.o. female with a past medical history of pulmonary hypertension, NICM, paroxysmal atrial flutter, atrial fibrillation, severe MR, NSVT, COPD, GERD, overactive bladder, gout, CKD.  10/28/2022 EF 55 to 60%, LA severely dilated, RA mildly dilated, severe functional MR, TR with mild to moderate, moderate calcification of the aortic valve with mild regurgitation and sclerosis present 07/28/2022 cardiac catheterization at [facility], mild to moderate nonobstructive CAD, RHC numbers elevated  Admitted to [facility] for heart failure exacerbation, UTI.  proBNP was elevated at 26,000.  Labs on day of discharge potassium 3.5, creatinine 1.6, sodium 141.  Evaluated by Dr. Fitch while she was hospitalized, she was started on Lasix  40 mg twice daily as well as 10 mEq of potassium.  Evaluated on 03/11/2023 accompanied by her husband for evaluation of worsening DOE over the last week.  She was volume overloaded, rales appreciated, proBNP  was elevated 11,734, we transitioned her to Bumex  1 mg twice daily.  Repeat creatinine increased to 1.83, GFR 27.  She presents today accompanied by her husband for follow-up after recent visit as outlined above.  Since transitioning her to Bumex , she feels her heart failure is better controlled, she is down 6 pounds since she was last evaluated.  Her pedal edema has improved, she does not have DOE as she did previously.  She denies chest pain, palpitations, dyspnea, pnd, orthopnea, n, v, dizziness, syncope, weight gain, or early satiety.   ROS: Review of Systems  Constitutional: Negative.   HENT: Negative.    Eyes: Negative.   Respiratory: Negative.    Cardiovascular:  Positive for leg swelling.   Gastrointestinal: Negative.   Genitourinary: Negative.   Musculoskeletal: Negative.   Skin: Negative.   Neurological: Negative.   Endo/Heme/Allergies:  Bruises/bleeds easily.  Psychiatric/Behavioral: Negative.       Studies Reviewed: .        Cardiac Studies & Procedures       ECHOCARDIOGRAM  ECHOCARDIOGRAM COMPLETE 07/08/2022  Narrative ECHOCARDIOGRAM REPORT    Patient Name:   Kerri Rogers Date of Exam: 07/08/2022 Medical Rec #:  [id number]   Height:       61.0 in Accession #:    [id number]  Weight:       159.6 lb Date of Birth:  [birth year]   BSA:          1.716 m Patient Age:    87 years    BP:           112/68 mmHg Patient Gender: F           HR:           76 bpm. Exam Location:    Procedure: 2D Echo, Cardiac Doppler, Color Doppler and Strain Analysis  Indications:    Shortness of breath [R06.02 (ICD-10-CM)]; Congestive heart failure, unspecified HF chronicity, unspecified heart failure type (HCC) [I50.9 (ICD-10-CM)]  History:        Patient has prior history of Echocardiogram examinations, most recent 09/03/2021. CHF, COPD, Arrythmias:Atrial Flutter, Signs/Symptoms:Shortness of Breath; Risk Factors:Hypertension.  Sonographer:    Lynwood Silvas RDCS Referring Phys: [phone] LAMAR PARAS KRASOWSKI  IMPRESSIONS   1. Left ventricular ejection fraction, by estimation, is 55 to 60%. The left ventricle  has normal function. The left ventricle has no regional wall motion abnormalities. There is mild left ventricular hypertrophy. Left ventricular diastolic parameters are indeterminate. 2. Right ventricular systolic function is normal. The right ventricular size is normal. There is severely elevated pulmonary artery systolic pressure. 3. Left atrial size was severely dilated. 4. Right atrial size was moderately dilated. 5. The mitral valve is normal in structure. Moderate to severe mitral valve regurgitation. No evidence of mitral stenosis. Moderate mitral annular  calcification. 6. Tricuspid valve regurgitation is moderate. 7. The aortic valve is normal in structure. Aortic valve regurgitation is mild. No aortic stenosis is present. 8. The inferior vena cava is normal in size with greater than 50% respiratory variability, suggesting right atrial pressure of 3 mmHg.  FINDINGS Left Ventricle: Left ventricular ejection fraction, by estimation, is 55 to 60%. The left ventricle has normal function. The left ventricle has no regional wall motion abnormalities. The left ventricular internal cavity size was normal in size. There is mild left ventricular hypertrophy. Left ventricular diastolic parameters are indeterminate.  Right Ventricle: The right ventricular size is normal. No increase in right ventricular wall thickness. Right ventricular systolic function is normal. There is severely elevated pulmonary artery systolic pressure. The tricuspid regurgitant velocity is 3.59 m/s, and with an assumed right atrial pressure of 15 mmHg, the estimated right ventricular systolic pressure is 66.6 mmHg.  Left Atrium: Left atrial size was severely dilated.  Right [facility]: Right atrial size was moderately dilated.  Pericardium: There is no evidence of pericardial effusion.  Mitral Valve: The mitral valve is normal in structure. Moderate mitral annular calcification. Moderate to severe mitral valve regurgitation. No evidence of mitral valve stenosis.  Tricuspid Valve: The tricuspid valve is normal in structure. Tricuspid valve regurgitation is moderate . No evidence of tricuspid stenosis.  Aortic Valve: The aortic valve is normal in structure. Aortic valve regurgitation is mild. No aortic stenosis is present.  Pulmonic Valve: The pulmonic valve was normal in structure. Pulmonic valve regurgitation is not visualized. No evidence of pulmonic stenosis.  Aorta: The aortic root is normal in size and structure.  Venous: The inferior vena cava is normal in size with greater  than 50% respiratory variability, suggesting right atrial pressure of 3 mmHg.  IAS/Shunts: No atrial level shunt detected by color flow Doppler.   LEFT VENTRICLE PLAX 2D LVIDd:         5.30 cm     Diastology LVIDs:         4.40 cm     LV e' medial:    3.15 cm/s LV PW:         1.40 cm     LV E/e' medial:  48.3 LV IVS:        1.40 cm     LV e' lateral:   4.50 cm/s LVOT diam:     2.00 cm     LV E/e' lateral: 33.8 LV SV:         47 LV SV Index:   27 LVOT Area:     3.14 cm  LV Volumes (MOD) LV vol d, MOD A2C: 93.2 ml LV vol d, MOD A4C: 91.1 ml LV vol s, MOD A2C: 56.6 ml LV vol s, MOD A4C: 52.4 ml LV SV MOD A2C:     36.6 ml LV SV MOD A4C:     91.1 ml LV SV MOD BP:      39.3 ml  RIGHT VENTRICLE  IVC RV S prime:     5.35 cm/s  IVC diam: 2.50 cm TAPSE (M-mode): 1.4 cm  LEFT ATRIUM              Index        RIGHT ATRIUM           Index LA diam:        5.70 cm  3.32 cm/m   RA Area:     25.90 cm LA Vol (A2C):   95.9 ml  55.88 ml/m  RA Volume:   87.90 ml  51.22 ml/m LA Vol (A4C):   106.0 ml 61.77 ml/m LA Biplane Vol: 104.0 ml 60.60 ml/m AORTIC VALVE LVOT Vmax:   68.50 cm/s LVOT Vmean:  51.600 cm/s LVOT VTI:    0.150 m  AORTA Ao Root diam: 3.00 cm Ao Asc diam:  3.60 cm Ao Desc diam: 2.30 cm  MITRAL VALVE                  TRICUSPID VALVE MV Area (PHT): 3.54 cm       TR Peak grad:   51.6 mmHg MV Decel Time: 214 msec       TR Vmax:        359.00 cm/s MR Peak grad:    114.1 mmHg MR Mean grad:    75.0 mmHg    SHUNTS MR Vmax:         534.00 cm/s  Systemic VTI:  0.15 m MR Vmean:        408.0 cm/s   Systemic Diam: 2.00 cm MR PISA:         2.26 cm MR PISA Eff ROA: 16 mm MR PISA Radius:  0.60 cm MV E velocity: 152.00 cm/s  Korrin Waterfield Crape MD Electronically signed by Amye Grego Crape MD Signature Date/Time: 07/08/2022/5:36:23 PM    Final   TEE  ECHO TEE 10/28/2022  Narrative TRANSESOPHOGEAL ECHO REPORT    Patient Name:   Kerri Rogers Date of Exam:  10/28/2022 Medical Rec #:  [id number]   Height:       61.0 in Accession #:    [id number]  Weight:       152.4 lb Date of Birth:  November 23, 1934   BSA:          1.683 m Patient Age:    87 years    BP:           159/85 mmHg Patient Gender: F           HR:           110 bpm. Exam Location:  Inpatient  Procedure: 2D Echo, 3D Echo, Cardiac Doppler and Color Doppler  Indications:    Severe mitral regurgitation  History:        Patient has prior history of Echocardiogram examinations, most recent 07/08/2022. CHF, COPD, Arrythmias:Atrial Fibrillation and Atrial Flutter, Signs/Symptoms:Shortness of Breath; Risk Factors:Hypertension.  Sonographer:    Clotilda Center Referring Phys: [id number] MAUDE JAYSON EMMER  PROCEDURE: After discussion of the risks and benefits of a TEE, an informed consent was obtained from the patient. TEE procedure time was 19 minutes. The transesophogeal probe was passed without difficulty through the esophogus of the patient. Imaged were obtained with the patient in a left lateral decubitus position. Sedation performed by different physician. The patient was monitored while under deep sedation. Anesthestetic sedation was provided intravenously by Anesthesiology: 208.95mg  of Propofol . Image quality was good. The patient's vital signs; including heart rate, blood pressure, and oxygen   saturation; remained stable throughout the procedure. The patient developed no complications during the procedure.  IMPRESSIONS   1. Left ventricular ejection fraction, by estimation, is 55 to 60%. The left ventricle has normal function. 2. Right ventricular systolic function is normal. The right ventricular size is normal. 3. Left atrial size was severely dilated. No left atrial/left atrial appendage thrombus was detected. 4. Right atrial size was mildly dilated. 5. Severe functional MR atrial FMR and tethered posterior leaflet MR is medial to A2/P2 with large area of calcium on anterior leaflet and  retracted short posterior leaflet with sub chordal calcification Not ideal for mitral clip. The mitral valve is abnormal. Severe mitral valve regurgitation. 6. Tricuspid valve regurgitation is mild to moderate. 7. The aortic valve is tricuspid. There is moderate calcification of the aortic valve. Aortic valve regurgitation is mild. Aortic valve sclerosis is present, with no evidence of aortic valve stenosis.  FINDINGS Left Ventricle: Left ventricular ejection fraction, by estimation, is 55 to 60%. The left ventricle has normal function. The left ventricular internal cavity size was normal in size.  Right Ventricle: The right ventricular size is normal. Right vetricular wall thickness was not assessed. Right ventricular systolic function is normal.  Left At[facility]: Left atrial size was severely dilated. No left atrial/left atrial appendage thrombus was detected.  Right [facility]: Right atrial size was mildly dilated.  Pericardium: There is no evidence of pericardial effusion.  Mitral Valve: Severe functional MR atrial FMR and tethered posterior leaflet MR is medial to A2/P2 with large area of calcium on anterior leaflet and retracted short posterior leaflet with sub chordal calcification Not ideal for mitral clip. The mitral valve is abnormal. Severe mitral valve regurgitation. MV peak gradient, 9.5 mmHg. The mean mitral valve gradient is 3.0 mmHg.  Tricuspid Valve: The tricuspid valve is normal in structure. Tricuspid valve regurgitation is mild to moderate.  Aortic Valve: The aortic valve is tricuspid. There is moderate calcification of the aortic valve. Aortic valve regurgitation is mild. Aortic valve sclerosis is present, with no evidence of aortic valve stenosis.  Pulmonic Valve: The pulmonic valve was normal in structure. Pulmonic valve regurgitation is mild.  Aorta: The aortic root is normal in size and structure.  IAS/Shunts: No atrial level shunt detected by color flow  Doppler.  Additional Comments: Spectral Doppler performed.  MITRAL VALVE MV Peak grad: 9.5 mmHg MV Mean grad: 3.0 mmHg MV Vmax:      1.54 m/s MV Vmean:     75.7 cm/s MR Peak grad:    143.0 mmHg MR Mean grad:    98.0 mmHg MR Vmax:         598.00 cm/s MR Vmean:        467.0 cm/s MR PISA:         1.57 cm MR PISA Eff ROA: 10 mm MR PISA Radius:  0.50 cm  Maude Emmer MD Electronically signed by Maude Emmer MD Signature Date/Time: 10/28/2022/10:04:51 AM    Final            Risk Assessment/Calculations:    CHA2DS2-VASc Score = 5   This indicates a 7.2% annual risk of stroke. The patient's score is based upon: CHF History: 1 HTN History: 1 Diabetes History: 0 Stroke History: 0 Vascular Disease History: 0 Age Score: 2 Gender Score: 1            Physical Exam:   VS:  BP 112/61   Pulse 82   Ht 5' 1 (1.549 m)   Wt  144 lb 12.8 oz (65.7 kg)   SpO2 95%   BMI 27.36 kg/m    Wt Readings from Last 3 Encounters:  03/31/23 144 lb 12.8 oz (65.7 kg)  03/11/23 150 lb (68 kg)  02/17/23 155 lb (70.3 kg)    GEN: Well nourished, well developed in no acute distress NECK: No JVD; No carotid bruits CARDIAC: irregularly irregular, no murmurs, rubs, gallops RESPIRATORY:  RLL with trace rales, wheezing or rhonchi  ABDOMEN: Soft, non-tender, non-distended EXTREMITIES:  legs wrapped with coban per home health RN; No deformity   ASSESSMENT AND PLAN: .    HFpEF- most recent echo 60-65%, NYHA class II today, euvolemic, weight is 144 today down from 150, trace rales RLL which is much improved since changing to Bumex , continue Farxiga 5 mg daily (started per nephrology), continue Entresto 24-26 mg twice daily, metoprolol  25 mg twice daily.  Further GDMT prohibited by kidney dysfunction. She will follow up with BMI nephrology in 1 month. Repeat labs after changing to Bumex  revealed increase in Cr from 1.65 > 1.83. Discussed the trade off with managing her heart failure and her kidney  dysfunction  CAD-nonobstructive per [facility] in 2023; continue nitroglycerin as needed, Stable with no anginal symptoms. No indication for ischemic evaluation.    PAF/hypercoagulable state- CHA2DS2-VASc score of 5, heart rate is controlled today at 82 bpm, denies hematochezia, hematuria, hemoptysis.  Continue Xarelto  15 mg daily--creatinine clearance 30 dose reduction indicated, continue digoxin  0.125 mg daily, continue metoprolol  25 mg twice daily.   Severe MR - severe functional MR per echo 10/28/22, repeat echo in 6-12 months.  CKD - followed by nephrology, was started on Farxiga 5 mg by them, has follow-up appointment with them in September, will send recent lab work to their office.       Dispo: Follow up with Dr. Krasowski in 2-3 months.   Signed, Delon JAYSON Hoover, NP "

## 2023-03-31 ENCOUNTER — Ambulatory Visit: Payer: PPO | Admitting: Cardiology

## 2023-03-31 ENCOUNTER — Encounter: Payer: Self-pay | Admitting: Cardiology

## 2023-03-31 ENCOUNTER — Other Ambulatory Visit: Payer: Self-pay

## 2023-03-31 ENCOUNTER — Ambulatory Visit: Payer: PPO | Attending: Cardiology | Admitting: Cardiology

## 2023-03-31 VITALS — BP 112/61 | HR 82 | Ht 61.0 in | Wt 144.8 lb

## 2023-03-31 DIAGNOSIS — I428 Other cardiomyopathies: Secondary | ICD-10-CM

## 2023-03-31 DIAGNOSIS — Z79899 Other long term (current) drug therapy: Secondary | ICD-10-CM | POA: Diagnosis not present

## 2023-03-31 DIAGNOSIS — I4821 Permanent atrial fibrillation: Secondary | ICD-10-CM | POA: Diagnosis not present

## 2023-03-31 DIAGNOSIS — D6859 Other primary thrombophilia: Secondary | ICD-10-CM | POA: Diagnosis not present

## 2023-03-31 DIAGNOSIS — I2510 Atherosclerotic heart disease of native coronary artery without angina pectoris: Secondary | ICD-10-CM

## 2023-03-31 DIAGNOSIS — I340 Nonrheumatic mitral (valve) insufficiency: Secondary | ICD-10-CM

## 2023-03-31 MED ORDER — BUMETANIDE 1 MG PO TABS: 1.0000 mg | ORAL_TABLET | Freq: Two times a day (BID) | ORAL | 3 refills | Status: DC

## 2023-03-31 NOTE — Patient Instructions (Signed)
Medication Instructions:  Your physician recommends that you continue on your current medications as directed. Please refer to the Current Medication list given to you today.  *If you need a refill on your cardiac medications before your next appointment, please call your pharmacy*   Lab Work: NONE If you have labs (blood work) drawn today and your tests are completely normal, you will receive your results only by: MyChart Message (if you have MyChart) OR A paper copy in the mail If you have any lab test that is abnormal or we need to change your treatment, we will call you to review the results.   Testing/Procedures: NONE   Follow-Up: At [facility], you and your health needs are our priority.  As part of our continuing mission to provide you with exceptional heart care, we have created designated Provider Care Teams.  These Care Teams include your primary Cardiologist (physician) and Advanced Practice Providers (APPs -  Physician Assistants and Nurse Practitioners) who all work together to provide you with the care you need, when you need it.  We recommend signing up for the patient portal called "MyChart".  Sign up information is provided on this After Visit Summary.  MyChart is used to connect with patients for Virtual Visits (Telemedicine).  Patients are able to view lab/test results, encounter notes, upcoming appointments, etc.  Non-urgent messages can be sent to your provider as well.   To learn more about what you can do with MyChart, go to [email or web address].    Your next appointment:   2 to 3 month(s)  Provider:   Gypsy Balsam, MD    Other Instructions

## 2023-06-16 ENCOUNTER — Ambulatory Visit: Payer: PPO | Attending: Cardiology | Admitting: Cardiology

## 2023-06-16 ENCOUNTER — Encounter: Payer: Self-pay | Admitting: Cardiology

## 2023-06-16 VITALS — BP 126/80 | HR 81 | Ht 61.0 in | Wt 149.4 lb

## 2023-06-16 DIAGNOSIS — I4821 Permanent atrial fibrillation: Secondary | ICD-10-CM | POA: Diagnosis not present

## 2023-06-16 DIAGNOSIS — I50814 Right heart failure due to left heart failure: Secondary | ICD-10-CM | POA: Diagnosis not present

## 2023-06-16 DIAGNOSIS — I340 Nonrheumatic mitral (valve) insufficiency: Secondary | ICD-10-CM

## 2023-06-16 DIAGNOSIS — R0609 Other forms of dyspnea: Secondary | ICD-10-CM

## 2023-06-16 DIAGNOSIS — I272 Pulmonary hypertension, unspecified: Secondary | ICD-10-CM | POA: Diagnosis not present

## 2023-06-16 DIAGNOSIS — I10 Essential (primary) hypertension: Secondary | ICD-10-CM

## 2023-06-16 NOTE — Addendum Note (Signed)
Addended by: Baldo Ash D on: 06/16/2023 01:17 PM   Modules accepted: Orders

## 2023-06-16 NOTE — Patient Instructions (Signed)
Medication Instructions:  Your physician recommends that you continue on your current medications as directed. Please refer to the Current Medication list given to you today.  *If you need a refill on your cardiac medications before your next appointment, please call your pharmacy*   Lab Work: BMP, ProBNP- today If you have labs (blood work) drawn today and your tests are completely normal, you will receive your results only by: MyChart Message (if you have MyChart) OR A paper copy in the mail If you have any lab test that is abnormal or we need to change your treatment, we will call you to review the results.   Testing/Procedures: Your physician has requested that you have an echocardiogram. Echocardiography is a painless test that uses sound waves to create images of your heart. It provides your doctor with information about the size and shape of your heart and how well your heart's chambers and valves are working. This procedure takes approximately one hour. There are no restrictions for this procedure. Please do NOT wear cologne, perfume, aftershave, or lotions (deodorant is allowed). Please arrive 15 minutes prior to your appointment time.    Follow-Up: At [facility], you and your health needs are our priority.  As part of our continuing mission to provide you with exceptional heart care, we have created designated Provider Care Teams.  These Care Teams include your primary Cardiologist (physician) and Advanced Practice Providers (APPs -  Physician Assistants and Nurse Practitioners) who all work together to provide you with the care you need, when you need it.  We recommend signing up for the patient portal called "MyChart".  Sign up information is provided on this After Visit Summary.  MyChart is used to connect with patients for Virtual Visits (Telemedicine).  Patients are able to view lab/test results, encounter notes, upcoming appointments, etc.  Non-urgent messages can be sent to  your provider as well.   To learn more about what you can do with MyChart, go to [email or web address].    Your next appointment:   5 month(s)  The format for your next appointment:   In Person  Provider:   Gypsy Balsam, MD    Other Instructions NA

## 2023-06-16 NOTE — Progress Notes (Signed)
Cardiology Office Note:    Date:  06/16/2023   ID:  Kerri Rogers, DOB 05/08/1935, MRN [id number]  PCP:  Judge Stall, MD  Cardiologist:  Gypsy Balsam, MD    Referring MD: Judge Stall, MD   Chief Complaint  Patient presents with   Follow-up    History of Present Illness:    Kerri Rogers is a 88 y.o. female with past medical history significant for nonischemic cardiomyopathy, pulmonary hypertension, permanent atrial fibrillation, severe mitral regurgitation, TEE done at the beginning of this year not a candidate for mitral valve clip because of calcified valve, COPD, GERD, chronic kidney failure.  Comes today to months for follow-up.  Overall she says she is doing well.  She denies have any chest pain tightness squeezing pressure burning chest she is very happy where she is.  Past Medical History:  Diagnosis Date   Adjustment disorder with physical complaints 07/26/2022   Asthma with bronchitis and status asthmaticus    Atrial fibrillation (HCC)    Atrial flutter, paroxysmal (HCC) 03/01/2015   B12 deficiency    Bladder spasm    CHF (congestive heart failure) (HCC)    Chronic constipation    COPD (chronic obstructive pulmonary disease) (HCC)    Edema    Essential hypertension 03/01/2015   GERD (gastroesophageal reflux disease)    Gout    Herniated lumbar intervertebral disc    History of pulmonary embolism 03/01/2015   Hypertension    Insomnia    Long term (current) use of anticoagulants    Nonischemic cardiomyopathy (HCC) 07/28/2022   Formatting of this note might be different from the original. EF 35-40%     Nonobstructive atherosclerosis of coronary artery 07/28/2022   NSVT (nonsustained ventricular tachycardia) (HCC) 08/22/2022   Overactive bladder    Peripheral autonomic neuropathy    Pre-ulcerative calluses    Pulmonary HTN (HCC) 07/28/2022   Formatting of this note might be different from the original. Probably due to MR and lung disease     Severe mitral  regurgitation 07/21/2022   Shortness of breath 06/18/2022   Skin rash    Stasis dermatitis    Ventral hernia    Vitamin D deficiency     Past Surgical History:  Procedure Laterality Date   ABDOMINAL HYSTERECTOMY     BLADDER SURGERY     CATARACT EXTRACTION     REPLACEMENT TOTAL KNEE BILATERAL     TEE WITHOUT CARDIOVERSION N/A 10/28/2022   Procedure: TRANSESOPHAGEAL ECHOCARDIOGRAM (TEE);  Surgeon: Wendall Stade, MD;  Location: [facility] ENDOSCOPY;  Service: Cardiovascular;  Laterality: N/A;   TONSILLECTOMY      Current Medications: Current Meds  Medication Sig   acetaminophen (TYLENOL) 500 MG tablet Take 1,000 mg by mouth 2 (two) times daily as needed for mild pain or moderate pain.   albuterol (VENTOLIN HFA) 108 (90 Base) MCG/ACT inhaler Inhale 1-2 puffs into the lungs every 4 (four) hours as needed for shortness of breath or wheezing.   bumetanide (BUMEX) 1 MG tablet Take 1 tablet (1 mg total) by mouth 2 (two) times daily.   CINNAMON PO Take 1,000 mg by mouth daily.   digoxin (LANOXIN) 0.125 MG tablet Take 0.0625 mg by mouth daily.   diphenhydramine-acetaminophen (TYLENOL PM) 25-500 MG TABS tablet Take 2 tablets by mouth at bedtime as needed (sleep).   ENTRESTO 24-26 MG Take 1 tablet by mouth 2 (two) times daily.   ferrous sulfate 325 (65 FE) MG tablet Take 325 mg by mouth daily.  fluticasone (FLONASE) 50 MCG/ACT nasal spray Place 2 sprays into both nostrils daily as needed for allergies.   hydrOXYzine (ATARAX) 25 MG tablet Take 1 tablet (25 mg total) by mouth every 6 (six) hours as needed for anxiety or itching.   latanoprost (XALATAN) 0.005 % ophthalmic solution Place 1 drop into both eyes at bedtime.   magnesium oxide (MAG-OX) 400 MG tablet Take 400 mg by mouth every Monday, Wednesday, and Friday.   melatonin 5 MG TABS Take 5 mg by mouth at bedtime.   metoprolol tartrate (LOPRESSOR) 25 MG tablet Take 25 mg by mouth 2 (two) times daily.   montelukast (SINGULAIR) 10 MG tablet Take 10  mg by mouth daily.   Multiple Vitamin (MULTIVITAMIN) tablet Take 1 tablet by mouth daily.   nitroGLYCERIN (NITROSTAT) 0.4 MG SL tablet Place 0.4 mg under the tongue every 5 (five) minutes as needed for chest pain.   oxybutynin (DITROPAN-XL) 10 MG 24 hr tablet Take 10 mg by mouth daily.   polyethylene glycol powder (GOODSENSE CLEARLAX) 17 GM/SCOOP powder Take 17 g by mouth daily.   potassium chloride SA (KLOR-CON M) 20 MEQ tablet Take 20 mEq by mouth daily.   SSD 1 % cream Apply 1 Application topically 2 (two) times daily.   traMADol (ULTRAM) 50 MG tablet Take 50 mg by mouth as needed for moderate pain or severe pain.   XARELTO 15 MG TABS tablet TAKE 1 TABLET BY MOUTH ONCE DAILY WITH SUPPER (Patient taking differently: Take 15 mg by mouth daily with supper.)   Zinc 50 MG TABS Take 50 mg by mouth daily.   [DISCONTINUED] dapagliflozin propanediol (FARXIGA) 5 MG TABS tablet Take 5 mg by mouth daily.     Allergies:   Flagyl [metronidazole], Penicillins, and Sulfamethoxazole-trimethoprim   Social History   Socioeconomic History   Marital status: Married    Spouse name: Not on file   Number of children: Not on file   Years of education: Not on file   Highest education level: Not on file  Occupational History   Not on file  Tobacco Use   Smoking status: Never   Smokeless tobacco: Never  Vaping Use   Vaping status: Never Used  Substance and Sexual Activity   Alcohol use: No   Drug use: No   Sexual activity: Not on file  Other Topics Concern   Not on file  Social History Narrative   Not on file   Social Determinants of Health   Financial Resource Strain: Low Risk  (08/21/2022)   Received from [facility], [facility]   Overall Financial Resource Strain (CARDIA)    Difficulty of Paying Living Expenses: Not hard at all  Food Insecurity: No Food Insecurity (08/21/2022)   Received from [facility], [facility]   Hunger Vital Sign    Worried About Running Out of Food in the Last  Year: Never true    Ran Out of Food in the Last Year: Never true  Transportation Needs: No Transportation Needs (08/21/2022)   Received from [employer], [facility]   PRAPARE - Transportation    Lack of Transportation (Medical): No    Lack of Transportation (Non-Medical): No  Physical Activity: Not on file  Stress: Not on file  Social Connections: Unknown (07/16/2022)   Received from [facility], [facility]   Social Network    Social Network: Not on file     Family History: The patient's family history includes Anxiety disorder in her sister; Asthma in her  sister; Atrial fibrillation in her sister; Diabetes in her mother; GER disease in her sister; Heart failure in her mother; Hypertension in her brother. There is no history of Thyroid disease. ROS:   Please see the history of present illness.    All 14 point review of systems negative except as described per history of present illness  EKGs/Labs/Other Studies Reviewed:         Recent Labs: 08/25/2022: ALT 12 03/11/2023: Hemoglobin 13.8; NT-Pro BNP 11,734; Platelets 194 03/23/2023: BUN 49; Creatinine, Ser 1.83; Potassium 4.1; Sodium 142  Recent Lipid Panel    Component Value Date/Time   CHOL 127 04/21/2019 0917   TRIG 164 (H) 04/21/2019 0917   HDL 33 (L) 04/21/2019 0917   CHOLHDL 3.8 04/21/2019 0917   LDLCALC 66 04/21/2019 0917    Physical Exam:    VS:  BP 126/80 (BP Location: Left Arm, Patient Position: Sitting)   Pulse 81   Ht 5\' 1"  (1.549 m)   Wt 149 lb 6.4 oz (67.8 kg)   SpO2 95%   BMI 28.23 kg/m     Wt Readings from Last 3 Encounters:  06/16/23 149 lb 6.4 oz (67.8 kg)  03/31/23 144 lb 12.8 oz (65.7 kg)  03/11/23 150 lb (68 kg)     GEN:  Well nourished, well developed in no acute distress HEENT: Normal NECK: No JVD; No carotid bruits LYMPHATICS: No lymphadenopathy CARDIAC: Irregularly irregular, no murmurs, no rubs, no gallops RESPIRATORY:  Clear to auscultation without rales, wheezing or rhonchi   ABDOMEN: Soft, non-tender, non-distended MUSCULOSKELETAL:  No edema; No deformity  SKIN: Warm and dry LOWER EXTREMITIES: Legs are wrapped in bandages NEUROLOGIC:  Alert and oriented x 3 PSYCHIATRIC:  Normal affect   ASSESSMENT:    1. Permanent atrial fibrillation (HCC)   2. 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 Essential hypertension   4. Pulmonary HTN (HCC)   5. Severe mitral regurgitation    PLAN:    In order of problems listed above:  Permanent atrial fibrillation, she is anticoag which I will continue.  She is on 50 mg of Xarelto which is appropriate to her kidney function. Essential hypertension blood pressure well-controlled continue present management. History of pulmonary hypertension most likely related mitral regurgitation.  I will repeat echocardiac to check severity of the valve but overall look like she will not be candidate for open heart surgery, she is not a candidate for mitral clip because of heavily calcified valve. Congestive heart failure: She seems to be compensated on physical exam.   Medication Adjustments/Labs and Tests Ordered: Current medicines are reviewed at length with the patient today.  Concerns regarding medicines are outlined above.  No orders of the defined types were placed in this encounter.  Medication changes: No orders of the defined types were placed in this encounter.   Signed, Georgeanna Lea, MD, [facility] 06/16/2023 1:10 PM     [facility]

## 2023-06-17 LAB — BASIC METABOLIC PANEL
BUN/Creatinine Ratio: 29 — ABNORMAL HIGH (ref 12–28)
BUN: 35 mg/dL — ABNORMAL HIGH (ref 8–27)
CO2: 23 mmol/L (ref 20–29)
Calcium: 10.3 mg/dL (ref 8.7–10.3)
Chloride: 103 mmol/L (ref 96–106)
Creatinine, Ser: 1.21 mg/dL — ABNORMAL HIGH (ref 0.57–1.00)
Glucose: 82 mg/dL (ref 70–99)
Potassium: 4 mmol/L (ref 3.5–5.2)
Sodium: 142 mmol/L (ref 134–144)
eGFR: 44 mL/min/{1.73_m2} — ABNORMAL LOW (ref >59)

## 2023-06-17 LAB — PRO B NATRIURETIC PEPTIDE: NT-Pro BNP: 4434 pg/mL — ABNORMAL HIGH (ref 0–738)

## 2023-07-03 ENCOUNTER — Telehealth: Payer: Self-pay

## 2023-07-03 NOTE — Telephone Encounter (Signed)
Patient notified of results and verbalized understanding.  

## 2023-07-03 NOTE — Telephone Encounter (Signed)
-----   Message from Gypsy Balsam sent at 06/18/2023  9:37 AM EDT ----- Labs are looking good, proBNP is acceptable, continue present management

## 2023-07-07 ENCOUNTER — Telehealth: Payer: Self-pay

## 2023-07-07 NOTE — Telephone Encounter (Signed)
   [facility] Pre-operative Risk Assessment    Request for surgical clearance:  What type of surgery is being performed? Right Total Shoulder Replacement   When is this surgery scheduled? TBD   What type of clearance is required (medical clearance vs. Pharmacy clearance to hold med vs. Both)? Both  Are there any medications that need to be held prior to surgery and how long?Not specified   Practice name and name of physician performing surgery? Dr. Linda Hedges  at [facility]   What is your office phone number: [phone]    7.   What is your office fax number: [phone]  8.   Anesthesia type (None, local, MAC, general) ? General Anesthesia   Kerri Rogers 07/07/2023, 12:13 PM  _________________________________________________________________   (provider comments below)

## 2023-07-07 NOTE — Telephone Encounter (Signed)
Good Morning Dr. Bing Matter  We have received a surgical clearance request for Ms. Schiano for right total shoulder replacement. They were seen recently in clinic on 06/16/2023. Can you please comment on surgical clearance for upcoming shoulder procedure. Please forward you guidance and recommendations to [facility] PREOP   Thank you, Robin Searing, NP

## 2023-07-07 NOTE — Telephone Encounter (Signed)
Pharmacy please advise on holding Xarelto prior to right total shoulder replacement scheduled for TBD. Thank you.

## 2023-07-08 NOTE — Telephone Encounter (Signed)
Patient with diagnosis of afib on Xarelto for anticoagulation.    Procedure:  Right Total Shoulder Replacement  Date of procedure: TBD   CHA2DS2-VASc Score = 5   This indicates a 7.2% annual risk of stroke. The patient's score is based upon: CHF History: 1 HTN History: 1 Diabetes History: 0 Stroke History: 0 Vascular Disease History: 0 Age Score: 2 Gender Score: 1      CrCl 29 ml/min Platelet count 194  Patient with a hx of a single PE on 2016  Per office protocol, patient can hold Xarelto for 4 days prior to procedure.    **This guidance is not considered finalized until pre-operative APP has relayed final recommendations.**

## 2023-07-13 NOTE — Telephone Encounter (Addendum)
S/w both the pt and he husband who tell me that the PCP felt surgery was a risk. Pt's husband tells me that they are going to d/w Dr. Deberah Castle further to see if his wife can have another injection instead.  Pt's husband surgery is on hold.   I asked the pt's husband if Dr. Deberah Castle is agreeable to an injection, that their office will need to send that request for clearance for injection.   I did explain that we were going to need to schedule in office appt after the echo has been completed and read. I advised the pt's husband to let us know if we will clearance for and injection, if so we will schedule appt then. Pt's husband felt that makes sense and agrees to let us know.   I will send back to preop as FYI only.

## 2023-07-13 NOTE — Telephone Encounter (Signed)
   Name: Kerri Rogers  DOB: 1935/05/24  MRN: [id number]  Primary Cardiologist: Gypsy Balsam, MD  Chart reviewed as part of pre-operative protocol coverage. Because of Adamary Poynter's past medical history and time since last visit, she will require a follow-up in-office visit in order to better assess preoperative cardiovascular risk.  Pre-op covering staff: - Please schedule appointment and call patient to inform them. If patient already had an upcoming appointment within acceptable timeframe, please add "pre-op clearance" to the appointment notes so provider is aware. - Please contact requesting surgeon's office via preferred method (i.e, phone, fax) to inform them of need for appointment prior to surgery.  Per office protocol, patient can hold Xarelto for 4 days prior to procedure.    She will also need an echo prior to procedure.    Sharlene Dory, PA-C  07/13/2023, 8:27 AM

## 2023-07-22 ENCOUNTER — Ambulatory Visit: Payer: PPO | Attending: Cardiology

## 2023-07-22 DIAGNOSIS — I340 Nonrheumatic mitral (valve) insufficiency: Secondary | ICD-10-CM | POA: Diagnosis not present

## 2023-07-22 LAB — ECHOCARDIOGRAM COMPLETE
Area-P 1/2: 4.21 cm2
MV M vel: 5.98 m/s
MV Peak grad: 143 mm[Hg]
P 1/2 time: 1026 ms
Radius: 0.7 cm
S' Lateral: 5.4 cm

## 2023-07-30 ENCOUNTER — Telehealth: Payer: Self-pay

## 2023-07-30 NOTE — Telephone Encounter (Signed)
Patient's husband is following up to review echo results.

## 2023-07-30 NOTE — Telephone Encounter (Signed)
Pt states that someone already called her husband back and reviewed results.

## 2023-07-30 NOTE — Telephone Encounter (Signed)
-----   Message from Gypsy Balsam sent at 07/23/2023 12:12 PM EST ----- Echocardiogram showed low normal ejection fraction, moderate to severe mitral valve regurgitation left atrium severely dilated.  Will discuss this during the visit

## 2023-07-30 NOTE — Telephone Encounter (Signed)
Called the patient to discuss results however, the patient states would rather have her husband call when available. I provided our phone number.

## 2023-08-05 ENCOUNTER — Encounter: Payer: Self-pay | Admitting: Cardiology

## 2023-08-05 ENCOUNTER — Ambulatory Visit: Payer: PPO | Attending: Cardiology | Admitting: Cardiology

## 2023-08-05 VITALS — BP 142/74 | HR 63 | Ht 61.0 in | Wt 147.8 lb

## 2023-08-05 DIAGNOSIS — I340 Nonrheumatic mitral (valve) insufficiency: Secondary | ICD-10-CM | POA: Diagnosis not present

## 2023-08-05 DIAGNOSIS — I10 Essential (primary) hypertension: Secondary | ICD-10-CM | POA: Diagnosis not present

## 2023-08-05 DIAGNOSIS — I4821 Permanent atrial fibrillation: Secondary | ICD-10-CM

## 2023-08-05 NOTE — Patient Instructions (Signed)

## 2023-08-05 NOTE — Progress Notes (Signed)
Cardiology Office Note:    Date:  08/05/2023   ID:  Kerri Rogers, DOB December 07, 1934, MRN [id number]  PCP:  Judge Stall, MD  Cardiologist:  Gypsy Balsam, MD    Referring MD: Judge Stall, MD   Chief Complaint  Patient presents with   Follow-up    History of Present Illness:    Kerri Rogers is a 88 y.o. female past medical history significant for nonischemic cardiomyopathy however latest ejection fraction still within normal limits, she got severe mitral regurgitation seems to be not a candidate for mitral valve clip because of heavy calcified valve overall not a candidate for open heart surgery so basically conservative approach.  Also COPD, GERD, chronic kidney failure. Comes today to months for follow-up for she is doing well.  She denies have any chest pain tightness squeezing pressure burning chest.  Does have some swelling of lower extremities but is controlled with compression stockings.  Past Medical History:  Diagnosis Date   Adjustment disorder with physical complaints 07/26/2022   Asthma with bronchitis and status asthmaticus    Atrial fibrillation (HCC)    Atrial flutter, paroxysmal (HCC) 03/01/2015   B12 deficiency    Bladder spasm    CHF (congestive heart failure) (HCC)    Chronic constipation    COPD (chronic obstructive pulmonary disease) (HCC)    Edema    Essential hypertension 03/01/2015   GERD (gastroesophageal reflux disease)    Gout    Herniated lumbar intervertebral disc    History of pulmonary embolism 03/01/2015   Hypertension    Insomnia    Long term (current) use of anticoagulants    Nonischemic cardiomyopathy (HCC) 07/28/2022   Formatting of this note might be different from the original. EF 35-40%     Nonobstructive atherosclerosis of coronary artery 07/28/2022   NSVT (nonsustained ventricular tachycardia) (HCC) 08/22/2022   Overactive bladder    Peripheral autonomic neuropathy    Pre-ulcerative calluses    Pulmonary HTN (HCC) 07/28/2022    Formatting of this note might be different from the original. Probably due to MR and lung disease     Severe mitral regurgitation 07/21/2022   Shortness of breath 06/18/2022   Skin rash    Stasis dermatitis    Ventral hernia    Vitamin D deficiency     Past Surgical History:  Procedure Laterality Date   ABDOMINAL HYSTERECTOMY     BLADDER SURGERY     CATARACT EXTRACTION     REPLACEMENT TOTAL KNEE BILATERAL     TEE WITHOUT CARDIOVERSION N/A 10/28/2022   Procedure: TRANSESOPHAGEAL ECHOCARDIOGRAM (TEE);  Surgeon: Wendall Stade, MD;  Location: [facility] ENDOSCOPY;  Service: Cardiovascular;  Laterality: N/A;   TONSILLECTOMY      Current Medications: Current Meds  Medication Sig   acetaminophen (TYLENOL) 500 MG tablet Take 1,000 mg by mouth 2 (two) times daily as needed for mild pain or moderate pain.   albuterol (VENTOLIN HFA) 108 (90 Base) MCG/ACT inhaler Inhale 1-2 puffs into the lungs every 4 (four) hours as needed for shortness of breath or wheezing.   bumetanide (BUMEX) 1 MG tablet Take 1 tablet (1 mg total) by mouth 2 (two) times daily.   CINNAMON PO Take 1,000 mg by mouth daily.   digoxin (LANOXIN) 0.125 MG tablet Take 0.0625 mg by mouth daily.   diphenhydramine-acetaminophen (TYLENOL PM) 25-500 MG TABS tablet Take 2 tablets by mouth at bedtime as needed (sleep).   ENTRESTO 24-26 MG Take 1 tablet by mouth 2 (two) times daily.  ferrous sulfate 325 (65 FE) MG tablet Take 325 mg by mouth daily.   fluticasone (FLONASE) 50 MCG/ACT nasal spray Place 2 sprays into both nostrils daily as needed for allergies.   hydrOXYzine (ATARAX) 25 MG tablet Take 1 tablet (25 mg total) by mouth every 6 (six) hours as needed for anxiety or itching.   latanoprost (XALATAN) 0.005 % ophthalmic solution Place 1 drop into both eyes at bedtime.   magnesium oxide (MAG-OX) 400 MG tablet Take 400 mg by mouth every Monday, Wednesday, and Friday.   melatonin 5 MG TABS Take 5 mg by mouth at bedtime.   metoprolol  tartrate (LOPRESSOR) 25 MG tablet Take 25 mg by mouth 2 (two) times daily.   montelukast (SINGULAIR) 10 MG tablet Take 10 mg by mouth daily.   Multiple Vitamin (MULTIVITAMIN) tablet Take 1 tablet by mouth daily.   nitroGLYCERIN (NITROSTAT) 0.4 MG SL tablet Place 0.4 mg under the tongue every 5 (five) minutes as needed for chest pain.   polyethylene glycol powder (GOODSENSE CLEARLAX) 17 GM/SCOOP powder Take 17 g by mouth daily.   potassium chloride SA (KLOR-CON M) 20 MEQ tablet Take 20 mEq by mouth daily.   SSD 1 % cream Apply 1 Application topically 2 (two) times daily.   traMADol (ULTRAM) 50 MG tablet Take 50 mg by mouth as needed for moderate pain or severe pain.   XARELTO 15 MG TABS tablet TAKE 1 TABLET BY MOUTH ONCE DAILY WITH SUPPER (Patient taking differently: Take 15 mg by mouth daily with supper.)   Zinc 50 MG TABS Take 50 mg by mouth daily.   [DISCONTINUED] oxybutynin (DITROPAN-XL) 10 MG 24 hr tablet Take 10 mg by mouth daily.     Allergies:   Flagyl [metronidazole], Penicillins, and Sulfamethoxazole-trimethoprim   Social History   Socioeconomic History   Marital status: Married    Spouse name: Not on file   Number of children: Not on file   Years of education: Not on file   Highest education level: Not on file  Occupational History   Not on file  Tobacco Use   Smoking status: Never   Smokeless tobacco: Never  Vaping Use   Vaping status: Never Used  Substance and Sexual Activity   Alcohol use: No   Drug use: No   Sexual activity: Not on file  Other Topics Concern   Not on file  Social History Narrative   Not on file   Social Drivers of Health   Financial Resource Strain: Low Risk  (08/21/2022)   Received from [facility], [facility]   Overall Financial Resource Strain (CARDIA)    Difficulty of Paying Living Expenses: Not hard at all  Food Insecurity: No Food Insecurity (08/21/2022)   Received from [facility], [facility]   Hunger Vital Sign    Worried  About Running Out of Food in the Last Year: Never true    Ran Out of Food in the Last Year: Never true  Transportation Needs: No Transportation Needs (08/21/2022)   Received from [employer], [facility]   PRAPARE - Transportation    Lack of Transportation (Medical): No    Lack of Transportation (Non-Medical): No  Physical Activity: Not on file  Stress: Not on file  Social Connections: Unknown (07/16/2022)   Received from [facility], [facility]   Social Network    Social Network: Not on file     Family History: The patient's family history includes Anxiety disorder in her sister; Asthma in her  sister; Atrial fibrillation in her sister; Diabetes in her mother; GER disease in her sister; Heart failure in her mother; Hypertension in her brother. There is no history of Thyroid disease. ROS:   Please see the history of present illness.    All 14 point review of systems negative except as described per history of present illness  EKGs/Labs/Other Studies Reviewed:         Recent Labs: 08/25/2022: ALT 12 03/11/2023: Hemoglobin 13.8; Platelets 194 06/16/2023: BUN 35; Creatinine, Ser 1.21; NT-Pro BNP 4,434; Potassium 4.0; Sodium 142  Recent Lipid Panel    Component Value Date/Time   CHOL 127 04/21/2019 0917   TRIG 164 (H) 04/21/2019 0917   HDL 33 (L) 04/21/2019 0917   CHOLHDL 3.8 04/21/2019 0917   LDLCALC 66 04/21/2019 0917    Physical Exam:    VS:  BP (!) 142/74 (BP Location: Right Arm, Patient Position: Sitting)   Pulse 63   Ht 5\' 1"  (1.549 m)   Wt 147 lb 12.8 oz (67 kg)   SpO2 96%   BMI 27.93 kg/m     Wt Readings from Last 3 Encounters:  08/05/23 147 lb 12.8 oz (67 kg)  06/16/23 149 lb 6.4 oz (67.8 kg)  03/31/23 144 lb 12.8 oz (65.7 kg)     GEN:  Well nourished, well developed in no acute distress HEENT: Normal NECK: No JVD; No carotid bruits LYMPHATICS: No lymphadenopathy CARDIAC: Irregular irregular, holosystolic murmur grade 2/6 to 3/6 best heard left  border of the sternum, no rubs, no gallops RESPIRATORY:  Clear to auscultation without rales, wheezing or rhonchi  ABDOMEN: Soft, non-tender, non-distended MUSCULOSKELETAL:  No edema; No deformity  SKIN: Warm and dry LOWER EXTREMITIES: no swelling NEUROLOGIC:  Alert and oriented x 3 PSYCHIATRIC:  Normal affect   ASSESSMENT:    1. Severe mitral regurgitation poor candidate for mitral valve clip, calcified valve   2. Permanent atrial fibrillation (HCC)   3. Essential hypertension    PLAN:    In order of problems listed above:  Severe mitral valve regurgitation not a candidate for any intervention overall hemodynamically stable will continue monitoring.  She check her weight on the regular basis I encouraged her to do that. Permanent atrial fibrillation rate controlled anticoagulated with Xarelto 15 mg daily which I will continue. Essential hypertension slightly elevated today but she just took her medications continue monitoring   Medication Adjustments/Labs and Tests Ordered: Current medicines are reviewed at length with the patient today.  Concerns regarding medicines are outlined above.  No orders of the defined types were placed in this encounter.  Medication changes: No orders of the defined types were placed in this encounter.   Signed, Georgeanna Lea, MD, [facility] 08/05/2023 9:46 AM    [facility]

## 2023-11-04 ENCOUNTER — Telehealth: Payer: Self-pay | Admitting: Cardiology

## 2023-11-04 NOTE — Telephone Encounter (Signed)
 Spoke with spouse and pt. For the last 3 or 4 days pt has had times when her heart has raced and she felt weak but with rest she recovered and felt better. BP 116/69 HR 95. No Chest pain, no leg, feet or ankle swelling and no shortness of breath. She stated that at this moment she was feeling fine. She is taking the same medications. They are seeking advice. Will talk with Dr. Bing Matter and call pt and spouse back.

## 2023-11-04 NOTE — Telephone Encounter (Signed)
 Patient c/o Palpitations: STAT if patient c/o lightheadedness, shortness of breath, or chest pain  How long have you had palpitations/irregular HR/ Afib? Are you having the symptoms now?  yes  Are you currently experiencing lightheadedness, SOB or CP? no  Do you have a history of afib (atrial fibrillation) or irregular heart rhythm? yes  Have you checked your BP or HR? (document readings if available): 116/69 HR 95  Are you experiencing any other symptoms? weakness

## 2023-11-04 NOTE — Telephone Encounter (Signed)
 Spoke with Dr. Bing Matter, He recommended that the pt continue to eat well, stay well hydrated and monitor her BP and HR for now. They will call if there are any changes in her condition.

## 2023-11-12 ENCOUNTER — Ambulatory Visit: Payer: PPO | Admitting: Cardiology

## 2023-11-17 ENCOUNTER — Ambulatory Visit: Payer: PPO | Admitting: Cardiology

## 2023-11-17 ENCOUNTER — Encounter: Payer: Self-pay | Admitting: Cardiology

## 2023-11-17 ENCOUNTER — Ambulatory Visit: Payer: PPO | Attending: Cardiology | Admitting: Cardiology

## 2023-11-17 VITALS — BP 142/74 | HR 71 | Ht 61.0 in | Wt 158.0 lb

## 2023-11-17 DIAGNOSIS — I340 Nonrheumatic mitral (valve) insufficiency: Secondary | ICD-10-CM | POA: Diagnosis not present

## 2023-11-17 DIAGNOSIS — I428 Other cardiomyopathies: Secondary | ICD-10-CM

## 2023-11-17 DIAGNOSIS — I50814 Right heart failure due to left heart failure: Secondary | ICD-10-CM | POA: Diagnosis not present

## 2023-11-17 DIAGNOSIS — J410 Simple chronic bronchitis: Secondary | ICD-10-CM

## 2023-11-17 DIAGNOSIS — I10 Essential (primary) hypertension: Secondary | ICD-10-CM

## 2023-11-17 NOTE — Progress Notes (Signed)
 Cardiology Office Note:    Date:  11/17/2023   ID:  [name], DOB 1935/07/22, MRN [id number]  PCP:  Judge Stall, MD  Cardiologist:  Gypsy Balsam, MD    Referring MD: Judge Stall, MD   Chief Complaint  Patient presents with   Follow-up    History of Present Illness:    Kerri Rogers is a 89 y.o. female past medical history significant for nonischemic cardiomyopathy last ejection fraction showed normalization, severe mitral valve regurgitation however not candidate for any intervention because of overall poor condition, not candidate for mitral clip because of heavily calcified valve.  Additional problem include COPD, GERD, chronic kidney failure. Comes to my office today with her husband doing well denies have any chest pain tightness squeezing pressure burning chest she is overall satisfied where she is  Past Medical History:  Diagnosis Date   Adjustment disorder with physical complaints 07/26/2022   Asthma with bronchitis and status asthmaticus    Atrial fibrillation (HCC)    Atrial flutter, paroxysmal (HCC) 03/01/2015   B12 deficiency    Bladder spasm    CHF (congestive heart failure) (HCC)    Chronic constipation    COPD (chronic obstructive pulmonary disease) (HCC)    Edema    Essential hypertension 03/01/2015   GERD (gastroesophageal reflux disease)    Gout    Herniated lumbar intervertebral disc    History of pulmonary embolism 03/01/2015   Hypertension    Insomnia    Long term (current) use of anticoagulants    Nonischemic cardiomyopathy (HCC) 07/28/2022   Formatting of this note might be different from the original. EF 35-40%     Nonobstructive atherosclerosis of coronary artery 07/28/2022   NSVT (nonsustained ventricular tachycardia) (HCC) 08/22/2022   Overactive bladder    Peripheral autonomic neuropathy    Pre-ulcerative calluses    Pulmonary HTN (HCC) 07/28/2022   Formatting of this note might be different from the original. Probably due to MR and  lung disease     Severe mitral regurgitation 07/21/2022   Shortness of breath 06/18/2022   Skin rash    Stasis dermatitis    Ventral hernia    Vitamin D deficiency     Past Surgical History:  Procedure Laterality Date   ABDOMINAL HYSTERECTOMY     BLADDER SURGERY     CATARACT EXTRACTION     REPLACEMENT TOTAL KNEE BILATERAL     TEE WITHOUT CARDIOVERSION N/A 10/28/2022   Procedure: TRANSESOPHAGEAL ECHOCARDIOGRAM (TEE);  Surgeon: Wendall Stade, MD;  Location: [facility] ENDOSCOPY;  Service: Cardiovascular;  Laterality: N/A;   TONSILLECTOMY      Current Medications: Current Meds  Medication Sig   acetaminophen (TYLENOL) 500 MG tablet Take 1,000 mg by mouth 2 (two) times daily as needed for mild pain or moderate pain.   albuterol (VENTOLIN HFA) 108 (90 Base) MCG/ACT inhaler Inhale 1-2 puffs into the lungs every 4 (four) hours as needed for shortness of breath or wheezing.   bumetanide (BUMEX) 1 MG tablet Take 1 tablet (1 mg total) by mouth 2 (two) times daily.   CINNAMON PO Take 1,000 mg by mouth daily.   digoxin (LANOXIN) 0.125 MG tablet Take 0.0625 mg by mouth daily.   diphenhydramine-acetaminophen (TYLENOL PM) 25-500 MG TABS tablet Take 2 tablets by mouth at bedtime as needed (sleep).   ENTRESTO 24-26 MG Take 1 tablet by mouth 2 (two) times daily.   ferrous sulfate 325 (65 FE) MG tablet Take 325 mg by mouth daily.   fluticasone (  FLONASE) 50 MCG/ACT nasal spray Place 2 sprays into both nostrils daily as needed for allergies.   hydrOXYzine (ATARAX) 25 MG tablet Take 1 tablet (25 mg total) by mouth every 6 (six) hours as needed for anxiety or itching.   latanoprost (XALATAN) 0.005 % ophthalmic solution Place 1 drop into both eyes at bedtime.   magnesium oxide (MAG-OX) 400 MG tablet Take 400 mg by mouth every Monday, Wednesday, and Friday.   melatonin 5 MG TABS Take 5 mg by mouth at bedtime.   metoprolol tartrate (LOPRESSOR) 25 MG tablet Take 25 mg by mouth 2 (two) times daily.   montelukast  (SINGULAIR) 10 MG tablet Take 10 mg by mouth daily.   Multiple Vitamin (MULTIVITAMIN) tablet Take 1 tablet by mouth daily.   nitroGLYCERIN (NITROSTAT) 0.4 MG SL tablet Place 0.4 mg under the tongue every 5 (five) minutes as needed for chest pain.   polyethylene glycol powder (GOODSENSE CLEARLAX) 17 GM/SCOOP powder Take 17 g by mouth daily.   potassium chloride SA (KLOR-CON M) 20 MEQ tablet Take 20 mEq by mouth daily.   SSD 1 % cream Apply 1 Application topically 2 (two) times daily.   traMADol (ULTRAM) 50 MG tablet Take 50 mg by mouth as needed for moderate pain or severe pain.   XARELTO 15 MG TABS tablet TAKE 1 TABLET BY MOUTH ONCE DAILY WITH SUPPER (Patient taking differently: Take 15 mg by mouth daily with supper.)   Zinc 50 MG TABS Take 50 mg by mouth daily.     Allergies:   Flagyl [metronidazole], Penicillins, and Sulfamethoxazole-trimethoprim   Social History   Socioeconomic History   Marital status: Married    Spouse name: Not on file   Number of children: Not on file   Years of education: Not on file   Highest education level: Not on file  Occupational History   Not on file  Tobacco Use   Smoking status: Never   Smokeless tobacco: Never  Vaping Use   Vaping status: Never Used  Substance and Sexual Activity   Alcohol use: No   Drug use: No   Sexual activity: Not on file  Other Topics Concern   Not on file  Social History Narrative   Not on file   Social Drivers of Health   Financial Resource Strain: Low Risk  (08/21/2022)   Received from [facility], [facility]   Overall Financial Resource Strain (CARDIA)    Difficulty of Paying Living Expenses: Not hard at all  Food Insecurity: No Food Insecurity (08/21/2022)   Received from [facility], [facility]   Hunger Vital Sign    Worried About Running Out of Food in the Last Year: Never true    Ran Out of Food in the Last Year: Never true  Transportation Needs: No Transportation Needs (08/21/2022)   Received from  [employer], [facility]   PRAPARE - Transportation    Lack of Transportation (Medical): No    Lack of Transportation (Non-Medical): No  Physical Activity: Not on file  Stress: Not on file  Social Connections: Unknown (07/16/2022)   Received from [facility], [facility]   Social Network    Social Network: Not on file     Family History: The patient's family history includes Anxiety disorder in her sister; Asthma in her sister; Atrial fibrillation in her sister; Diabetes in her mother; GER disease in her sister; Heart failure in her mother; Hypertension in her brother. There is no history of Thyroid disease. ROS:  Please see the history of present illness.    All 14 point review of systems negative except as described per history of present illness  EKGs/Labs/Other Studies Reviewed:         Recent Labs: 03/11/2023: Hemoglobin 13.8; Platelets 194 06/16/2023: BUN 35; Creatinine, Ser 1.21; NT-Pro BNP 4,434; Potassium 4.0; Sodium 142  Recent Lipid Panel    Component Value Date/Time   CHOL 127 04/21/2019 0917   TRIG 164 (H) 04/21/2019 0917   HDL 33 (L) 04/21/2019 0917   CHOLHDL 3.8 04/21/2019 0917   LDLCALC 66 04/21/2019 0917    Physical Exam:    VS:  BP (!) 142/74 (BP Location: Left Arm, Patient Position: Sitting)   Pulse 71   Ht 5\' 1"  (1.549 m)   Wt 158 lb (71.7 kg)   SpO2 95%   BMI 29.85 kg/m     Wt Readings from Last 3 Encounters:  11/17/23 158 lb (71.7 kg)  08/05/23 147 lb 12.8 oz (67 kg)  06/16/23 149 lb 6.4 oz (67.8 kg)     GEN:  Well nourished, well developed in no acute distress HEENT: Normal NECK: No JVD; No carotid bruits LYMPHATICS: No lymphadenopathy CARDIAC: RRR, holosystolic murmur grade 3/6 best heard left border of the sternum and apex, no rubs, no gallops RESPIRATORY:  Clear to auscultation without rales, wheezing or rhonchi  ABDOMEN: Soft, non-tender, non-distended MUSCULOSKELETAL:  No edema; No deformity  SKIN: Warm and dry LOWER  EXTREMITIES: no swelling NEUROLOGIC:  Alert and oriented x 3 PSYCHIATRIC:  Normal affect   ASSESSMENT:    1. 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 Essential hypertension   3. Nonischemic cardiomyopathy (HCC)   4. Severe mitral regurgitation poor candidate for mitral valve clip, calcified valve   5. Simple chronic bronchitis (HCC)    PLAN:    In order of problems listed above:  Congestive heart failure.  Seems to be compensated on physical exam continue present management. Essential hypertension, blood pressure slightly on the higher side but may check at home of prescription continue monitoring. Nonischemic cardiomyopathy however overall ejection fraction last echocardiogram showed preserved.  Continue present management. Severe mitral valve regurgitation Evaluate for any intervention apparently looks stable continue present management   Medication Adjustments/Labs and Tests Ordered: Current medicines are reviewed at length with the patient today.  Concerns regarding medicines are outlined above.  No orders of the defined types were placed in this encounter.  Medication changes: No orders of the defined types were placed in this encounter.   Signed, Georgeanna Lea, MD, [facility] 11/17/2023 4:37 PM    [facility]

## 2023-11-17 NOTE — Patient Instructions (Signed)
Medication Instructions:  Your physician recommends that you continue on your current medications as directed. Please refer to the Current Medication list given to you today.  *If you need a refill on your cardiac medications before your next appointment, please call your pharmacy*   Lab Work: None Ordered If you have labs (blood work) drawn today and your tests are completely normal, you will receive your results only by: MyChart Message (if you have MyChart) OR A paper copy in the mail If you have any lab test that is abnormal or we need to change your treatment, we will call you to review the results.   Testing/Procedures: None Ordered   Follow-Up: At [facility], you and your health needs are our priority.  As part of our continuing mission to provide you with exceptional heart care, we have created designated Provider Care Teams.  These Care Teams include your primary Cardiologist (physician) and Advanced Practice Providers (APPs -  Physician Assistants and Nurse Practitioners) who all work together to provide you with the care you need, when you need it.  We recommend signing up for the patient portal called "MyChart".  Sign up information is provided on this After Visit Summary.  MyChart is used to connect with patients for Virtual Visits (Telemedicine).  Patients are able to view lab/test results, encounter notes, upcoming appointments, etc.  Non-urgent messages can be sent to your provider as well.   To learn more about what you can do with MyChart, go to [email or web address].    Your next appointment:   4 month(s)  The format for your next appointment:   In Person  Provider:   Robert Krasowski, MD    Other Instructions NA  

## 2024-03-03 ENCOUNTER — Telehealth: Payer: Self-pay | Admitting: Cardiology

## 2024-03-03 NOTE — Telephone Encounter (Signed)
 Sent to front desk for pt to come for Nurse visit tomorrow for an EKG. Appt made.

## 2024-03-03 NOTE — Telephone Encounter (Signed)
 Patient c/o Palpitations: STAT if patient c/o lightheadedness, shortness of breath, or chest pain  How long have you had palpitations/irregular HR/ Afib? Are you having the symptoms now?  Husband says yesterday patient had palpitations and he gave her a nitroglycerin. He says the same thing happened today and he had to give her another nitroglycerin. He says she didn't have any pain, just a little weakness  Are you currently experiencing lightheadedness, SOB or CP?  No   Do you have a history of afib (atrial fibrillation) or irregular heart rhythm?  Has afib  Have you checked your BP or HR? (document readings if available):  Was normal yesterday; hasn't checked today  Are you experiencing any other symptoms?  Weakness

## 2024-03-03 NOTE — Telephone Encounter (Signed)
 Patient's husband is following up. He says he spoke with a nurse this morning and was promised his call would be returned after receiving feedback from Dr. Krasowski, but he hasn't heard back yet. He says he does not want to rush MD but he has concerns because he was hopeful to have this addressed before EOD. Please advise.

## 2024-03-03 NOTE — Telephone Encounter (Signed)
 Called the patient and Gordy her husband answered the phone and reported that the patient started having palpitations and a faster heart rate yesterday. Gordy gave her a nitroglycerin yesterday and the palpitations and faster heart rate stopped. Today she woke up and was having the palpitations and rapid heart rate again. Today her blood pressure and heart rate has been: 165/90 HR 116, 159/86 HR 115 and the last reading today was 122/86 HR 69. Gordy states that the patient does have a history of A-fib. Please advise.

## 2024-03-04 ENCOUNTER — Ambulatory Visit: Attending: Cardiology | Admitting: Cardiology

## 2024-03-04 VITALS — BP 122/74 | HR 83 | Ht 61.0 in | Wt 159.6 lb

## 2024-03-04 DIAGNOSIS — I428 Other cardiomyopathies: Secondary | ICD-10-CM | POA: Diagnosis not present

## 2024-03-04 DIAGNOSIS — R0609 Other forms of dyspnea: Secondary | ICD-10-CM

## 2024-03-04 DIAGNOSIS — I10 Essential (primary) hypertension: Secondary | ICD-10-CM | POA: Diagnosis not present

## 2024-03-04 DIAGNOSIS — I4821 Permanent atrial fibrillation: Secondary | ICD-10-CM | POA: Diagnosis not present

## 2024-03-04 DIAGNOSIS — I340 Nonrheumatic mitral (valve) insufficiency: Secondary | ICD-10-CM

## 2024-03-04 DIAGNOSIS — I50814 Right heart failure due to left heart failure: Secondary | ICD-10-CM | POA: Diagnosis not present

## 2024-03-04 NOTE — Addendum Note (Signed)
 Addended by: ARLOA PLANAS D on: 03/04/2024 10:25 AM   Modules accepted: Orders

## 2024-03-04 NOTE — Patient Instructions (Signed)
 Medication Instructions:  Your physician recommends that you continue on your current medications as directed. Please refer to the Current Medication list given to you today.  *If you need a refill on your cardiac medications before your next appointment, please call your pharmacy*   Lab Work: CMP, ProBNP- today If you have labs (blood work) drawn today and your tests are completely normal, you will receive your results only by: MyChart Message (if you have MyChart) OR A paper copy in the mail If you have any lab test that is abnormal or we need to change your treatment, we will call you to review the results.   Testing/Procedures: None Ordered   Follow-Up: At [facility], you and your health needs are our priority.  As part of our continuing mission to provide you with exceptional heart care, we have created designated Provider Care Teams.  These Care Teams include your primary Cardiologist (physician) and Advanced Practice Providers (APPs -  Physician Assistants and Nurse Practitioners) who all work together to provide you with the care you need, when you need it.  We recommend signing up for the patient portal called MyChart.  Sign up information is provided on this After Visit Summary.  MyChart is used to connect with patients for Virtual Visits (Telemedicine).  Patients are able to view lab/test results, encounter notes, upcoming appointments, etc.  Non-urgent messages can be sent to your provider as well.   To learn more about what you can do with MyChart, go to [email or web address].    Your next appointment:   1 week(s)  The format for your next appointment:   In Person  Provider:   Lamar Fitch, MD    Other Instructions NA

## 2024-03-04 NOTE — Progress Notes (Signed)
 Cardiology Office Note:    Date:  03/04/2024   ID:  Donia Sharps, DOB 1935-02-03, MRN [id number]  PCP:  Lennie Boom, MD  Cardiologist:  Lamar Fitch, MD    Referring MD: Lennie Boom, MD   Chief Complaint  Patient presents with   nurse visit    History of Present Illness:    Kerri Rogers is a 89 y.o. female past medical history significant for nonischemic cardiomyopathy last ejection fraction showed normalization, severe mitral valve regurgitation however not candidate for any intervention because of overall poor condition, not candidate for mitral clip because of heavily calcified valve. Additional problem include COPD, GERD, chronic kidney failure.  Requested to be seen today because of some issues.  Shortness of breath is worse she also had palpitation gained few pounds.  Her husband who is really doing excellent job taking care of her noticed the problem and requested her to be seen.  Yesterday she did contact her primary care physician will ask her to double the dose of diuretics.  Today slightly better.  No chest pain tightness squeezing pressure mid chest  Past Medical History:  Diagnosis Date   Adjustment disorder with physical complaints 07/26/2022   Asthma with bronchitis and status asthmaticus    Atrial fibrillation (HCC)    Atrial flutter, paroxysmal (HCC) 03/01/2015   B12 deficiency    Bladder spasm    CHF (congestive heart failure) (HCC)    Chronic constipation    COPD (chronic obstructive pulmonary disease) (HCC)    Edema    Essential hypertension 03/01/2015   GERD (gastroesophageal reflux disease)    Gout    Herniated lumbar intervertebral disc    History of pulmonary embolism 03/01/2015   Hypertension    Insomnia    Long term (current) use of anticoagulants    Nonischemic cardiomyopathy (HCC) 07/28/2022   Formatting of this note might be different from the original. EF 35-40%     Nonobstructive atherosclerosis of coronary artery 07/28/2022   NSVT  (nonsustained ventricular tachycardia) (HCC) 08/22/2022   Overactive bladder    Peripheral autonomic neuropathy    Pre-ulcerative calluses    Pulmonary HTN (HCC) 07/28/2022   Formatting of this note might be different from the original. Probably due to MR and lung disease     Severe mitral regurgitation 07/21/2022   Shortness of breath 06/18/2022   Skin rash    Stasis dermatitis    Ventral hernia    Vitamin D  deficiency     Past Surgical History:  Procedure Laterality Date   ABDOMINAL HYSTERECTOMY     BLADDER SURGERY     CATARACT EXTRACTION     REPLACEMENT TOTAL KNEE BILATERAL     TEE WITHOUT CARDIOVERSION N/A 10/28/2022   Procedure: TRANSESOPHAGEAL ECHOCARDIOGRAM (TEE);  Surgeon: Delford Maude BROCKS, MD;  Location: [facility] ENDOSCOPY;  Service: Cardiovascular;  Laterality: N/A;   TONSILLECTOMY      Current Medications: Current Meds  Medication Sig   acetaminophen  (TYLENOL ) 500 MG tablet Take 1,000 mg by mouth 2 (two) times daily as needed for mild pain or moderate pain.   albuterol (VENTOLIN HFA) 108 (90 Base) MCG/ACT inhaler Inhale 1-2 puffs into the lungs every 4 (four) hours as needed for shortness of breath or wheezing.   bumetanide  (BUMEX ) 1 MG tablet Take 1 tablet (1 mg total) by mouth 2 (two) times daily. (Patient taking differently: Take 2 mg by mouth 2 (two) times daily.)   CINNAMON PO Take 1,000 mg by mouth daily.   digoxin  (  LANOXIN ) 0.125 MG tablet Take 0.0625 mg by mouth daily.   diphenhydramine-acetaminophen  (TYLENOL  PM) 25-500 MG TABS tablet Take 2 tablets by mouth at bedtime as needed (sleep).   ENTRESTO 24-26 MG Take 1 tablet by mouth 2 (two) times daily.   ferrous sulfate 325 (65 FE) MG tablet Take 325 mg by mouth daily.   fluticasone (FLONASE) 50 MCG/ACT nasal spray Place 2 sprays into both nostrils daily as needed for allergies.   hydrOXYzine  (ATARAX ) 25 MG tablet Take 1 tablet (25 mg total) by mouth every 6 (six) hours as needed for anxiety or itching.   latanoprost  (XALATAN) 0.005 % ophthalmic solution Place 1 drop into both eyes at bedtime.   magnesium oxide (MAG-OX) 400 MG tablet Take 400 mg by mouth every Monday, Wednesday, and Friday.   melatonin 5 MG TABS Take 5 mg by mouth at bedtime.   metoprolol  tartrate (LOPRESSOR ) 25 MG tablet Take 25 mg by mouth 2 (two) times daily.   montelukast (SINGULAIR) 10 MG tablet Take 10 mg by mouth daily.   Multiple Vitamin (MULTIVITAMIN) tablet Take 1 tablet by mouth daily.   nitroGLYCERIN (NITROSTAT) 0.4 MG SL tablet Place 0.4 mg under the tongue every 5 (five) minutes as needed for chest pain.   polyethylene glycol powder (GOODSENSE CLEARLAX) 17 GM/SCOOP powder Take 17 g by mouth daily.   potassium chloride SA (KLOR-CON M) 20 MEQ tablet Take 20 mEq by mouth daily.   SSD 1 % cream Apply 1 Application topically 2 (two) times daily.   traMADol (ULTRAM) 50 MG tablet Take 50 mg by mouth as needed for moderate pain or severe pain.   XARELTO  15 MG TABS tablet TAKE 1 TABLET BY MOUTH ONCE DAILY WITH SUPPER (Patient taking differently: Take 15 mg by mouth daily with supper.)   Zinc 50 MG TABS Take 50 mg by mouth daily.     Allergies:   Flagyl [metronidazole], Penicillins, and Sulfamethoxazole-trimethoprim   Social History   Socioeconomic History   Marital status: Married    Spouse name: Not on file   Number of children: Not on file   Years of education: Not on file   Highest education level: Not on file  Occupational History   Not on file  Tobacco Use   Smoking status: Never   Smokeless tobacco: Never  Vaping Use   Vaping status: Never Used  Substance and Sexual Activity   Alcohol use: No   Drug use: No   Sexual activity: Not on file  Other Topics Concern   Not on file  Social History Narrative   Not on file   Social Drivers of Health   Financial Resource Strain: Low Risk  (08/21/2022)   [facility]   Overall Financial Resource Strain (CARDIA)    Difficulty of Paying Living Expenses: Not  hard at all  Food Insecurity: No Food Insecurity (08/21/2022)   [facility] from [facility]   Hunger Vital Sign    Within the past 12 months, you worried that your food would run out before you got the money to buy more.: Never true    Within the past 12 months, the food you bought just didn't last and you didn't have money to get more.: Never true  Transportation Needs: No Transportation Needs (08/21/2022)   Received from [facility] - Transportation    Lack of Transportation (Medical): No    Lack of Transportation (Non-Medical): No  Physical Activity: Not on file  Stress: Not  on file  Social Connections: Unknown (07/16/2022)   Received from [facility]   Social Network    Social Network: Not on file     Family History: The patient's family history includes Anxiety disorder in her sister; Asthma in her sister; Atrial fibrillation in her sister; Diabetes in her mother; GER disease in her sister; Heart failure in her mother; Hypertension in her brother. There is no history of Thyroid  disease. ROS:   Please see the history of present illness.    All 14 point review of systems negative except as described per history of present illness  EKGs/Labs/Other Studies Reviewed:    EKG Interpretation Date/Time:  Friday March 04 2024 09:58:58 EDT Ventricular Rate:  83 PR Interval:    QRS Duration:  110 QT Interval:  352 QTC Calculation: 413 R Axis:   34  Text Interpretation: Atrial fibrillation with premature ventricular or aberrantly conducted complexes Septal infarct , age undetermined Abnormal ECG No previous ECGs available Confirmed by Bernie Charleston [phone]) on 03/04/2024 10:20:02 AM    Recent Labs: 03/11/2023: Hemoglobin 13.8; Platelets 194 06/16/2023: BUN 35; Creatinine, Ser 1.21; NT-Pro BNP 4,434; Potassium 4.0; Sodium 142  Recent Lipid Panel    Component Value Date/Time   CHOL 127 04/21/2019 0917   TRIG 164 (H) 04/21/2019 0917   HDL 33 (L) 04/21/2019 0917    CHOLHDL 3.8 04/21/2019 0917   LDLCALC 66 04/21/2019 0917    Physical Exam:    VS:  BP 122/74 (BP Location: Right Arm, Patient Position: Sitting)   Pulse 83   Ht 5' 1 (1.549 m)   Wt 159 lb 9.6 oz (72.4 kg)   SpO2 94%   BMI 30.16 kg/m     Wt Readings from Last 3 Encounters:  03/04/24 159 lb 9.6 oz (72.4 kg)  11/17/23 158 lb (71.7 kg)  08/05/23 147 lb 12.8 oz (67 kg)     GEN:  Well nourished, well developed in no acute distress HEENT: Normal NECK: No JVD; No carotid bruits LYMPHATICS: No lymphadenopathy CARDIAC: Regularly irregular, holosystolic murmur grade 2/6 to 3/6 percent of apex, no rubs, no gallops RESPIRATORY:  Clear to auscultation without rales, wheezing or rhonchi  ABDOMEN: Soft, non-tender, non-distended MUSCULOSKELETAL:  No edema; No deformity  SKIN: Warm and dry LOWER EXTREMITIES: 2+ swelling NEUROLOGIC:  Alert and oriented x 3 PSYCHIATRIC:  Normal affect   ASSESSMENT:    1. Essential hypertension   2. Permanent atrial fibrillation (HCC)   3. Nonischemic cardiomyopathy (HCC)   4. 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 Severe mitral regurgitation poor candidate for mitral valve clip, calcified valve    PLAN:    In order of problems listed above:  Decompensated CHF.  Doubled the dose of diuretic which we will continue will check Chem-7 and proBNP today, I asked him to double diuretic for 2 days and then suddenly go back to 1 tablet twice daily, I will contact her at the beginning of next week with instruction, I see her back in the end of the week.  They planning trip to mountains.  I will go to the place from the spine honeymoon monitor 60 years ago. Nonischemic cardiomyopathy.  Noted.  Mitral vegetations contributing sadly she is not a candidate for surgery, she is not a candidate for clip because of heavy calcified valves.   Medication Adjustments/Labs and Tests Ordered: Current medicines are  reviewed at length with the patient today.  Concerns regarding medicines are  outlined above.  Orders Placed This Encounter  Procedures   EKG 12-Lead   Medication changes: No orders of the defined types were placed in this encounter.   Signed, Lamar DOROTHA Fitch, MD, [facility] 03/04/2024 10:20 AM    [facility]

## 2024-03-05 LAB — COMPREHENSIVE METABOLIC PANEL WITH GFR
ALT: 28 IU/L (ref 0–32)
AST: 20 IU/L (ref 0–40)
Albumin: 4.1 g/dL (ref 3.7–4.7)
Alkaline Phosphatase: 54 IU/L (ref 44–121)
BUN/Creatinine Ratio: 30 — ABNORMAL HIGH (ref 12–28)
BUN: 39 mg/dL — ABNORMAL HIGH (ref 8–27)
Bilirubin Total: 1.5 mg/dL — ABNORMAL HIGH (ref 0.0–1.2)
CO2: 23 mmol/L (ref 20–29)
Calcium: 10.1 mg/dL (ref 8.7–10.3)
Chloride: 102 mmol/L (ref 96–106)
Creatinine, Ser: 1.31 mg/dL — ABNORMAL HIGH (ref 0.57–1.00)
Globulin, Total: 1.9 g/dL (ref 1.5–4.5)
Glucose: 96 mg/dL (ref 70–99)
Potassium: 3.8 mmol/L (ref 3.5–5.2)
Sodium: 141 mmol/L (ref 134–144)
Total Protein: 6 g/dL (ref 6.0–8.5)
eGFR: 39 mL/min/1.73 — ABNORMAL LOW (ref >59)

## 2024-03-05 LAB — PRO B NATRIURETIC PEPTIDE: NT-Pro BNP: 9956 pg/mL — ABNORMAL HIGH (ref 0–738)

## 2024-03-10 ENCOUNTER — Ambulatory Visit: Attending: Cardiology | Admitting: Cardiology

## 2024-03-10 ENCOUNTER — Encounter: Payer: Self-pay | Admitting: Cardiology

## 2024-03-10 VITALS — BP 130/80 | HR 85 | Ht 62.0 in | Wt 158.6 lb

## 2024-03-10 DIAGNOSIS — Z86711 Personal history of pulmonary embolism: Secondary | ICD-10-CM

## 2024-03-10 DIAGNOSIS — I10 Essential (primary) hypertension: Secondary | ICD-10-CM | POA: Diagnosis not present

## 2024-03-10 DIAGNOSIS — I428 Other cardiomyopathies: Secondary | ICD-10-CM | POA: Diagnosis not present

## 2024-03-10 DIAGNOSIS — R0609 Other forms of dyspnea: Secondary | ICD-10-CM

## 2024-03-10 DIAGNOSIS — I4821 Permanent atrial fibrillation: Secondary | ICD-10-CM | POA: Diagnosis not present

## 2024-03-10 MED ORDER — FUROSCIX 80 MG/10ML ~~LOC~~ CTKT: 1.0000 | CARTRIDGE | Freq: Once | SUBCUTANEOUS | Status: DC

## 2024-03-10 NOTE — Addendum Note (Signed)
 Addended by: ARLOA PLANAS D on: 03/10/2024 03:07 PM   Modules accepted: Orders

## 2024-03-10 NOTE — Progress Notes (Signed)
 Cardiology Office Note:    Date:  03/10/2024   ID:  Kerri Rogers, DOB 09-20-1934, MRN [id number]  PCP:  Lennie Boom, MD  Cardiologist:  Lamar Fitch, MD    Referring MD: Lennie Boom, MD   Chief Complaint  Patient presents with   Hospitalization Follow-up    History of Present Illness:    Kerri Rogers is a 89 y.o. female past medical history significant for nonischemic cardiomyopathy, last ejection fraction showed normalization however she does have significant mitral valve regurgitation not a candidate for intervention because of poor condition noted candidate for mitral valve clip because of heavy calcified valve additional problem include COPD, GERD, chronic kidney failure.  Comes today to my office after being in the emergency room yesterday she was given IV furosemide  and diuresed quite well started feeling well but this morning still feeling poorly denies have any chest pain tightness squeezing pressure burning chest otherwise.  Past Medical History:  Diagnosis Date   Adjustment disorder with physical complaints 07/26/2022   Asthma with bronchitis and status asthmaticus    Atrial fibrillation (HCC)    Atrial flutter, paroxysmal (HCC) 03/01/2015   B12 deficiency    Bladder spasm    CHF (congestive heart failure) (HCC)    Chronic constipation    COPD (chronic obstructive pulmonary disease) (HCC)    Edema    Essential hypertension 03/01/2015   GERD (gastroesophageal reflux disease)    Gout    Herniated lumbar intervertebral disc    History of pulmonary embolism 03/01/2015   Hypertension    Insomnia    Long term (current) use of anticoagulants    Nonischemic cardiomyopathy (HCC) 07/28/2022   Formatting of this note might be different from the original. EF 35-40%     Nonobstructive atherosclerosis of coronary artery 07/28/2022   NSVT (nonsustained ventricular tachycardia) (HCC) 08/22/2022   Overactive bladder    Peripheral autonomic neuropathy    Pre-ulcerative  calluses    Pulmonary HTN (HCC) 07/28/2022   Formatting of this note might be different from the original. Probably due to MR and lung disease     Severe mitral regurgitation 07/21/2022   Shortness of breath 06/18/2022   Skin rash    Stasis dermatitis    Ventral hernia    Vitamin D  deficiency     Past Surgical History:  Procedure Laterality Date   ABDOMINAL HYSTERECTOMY     BLADDER SURGERY     CATARACT EXTRACTION     REPLACEMENT TOTAL KNEE BILATERAL     TEE WITHOUT CARDIOVERSION N/A 10/28/2022   Procedure: TRANSESOPHAGEAL ECHOCARDIOGRAM (TEE);  Surgeon: Delford Maude BROCKS, MD;  Location: [facility] ENDOSCOPY;  Service: Cardiovascular;  Laterality: N/A;   TONSILLECTOMY      Current Medications: Current Meds  Medication Sig   acetaminophen  (TYLENOL ) 500 MG tablet Take 1,000 mg by mouth 2 (two) times daily as needed for mild pain or moderate pain.   albuterol (VENTOLIN HFA) 108 (90 Base) MCG/ACT inhaler Inhale 1-2 puffs into the lungs every 4 (four) hours as needed for shortness of breath or wheezing.   bumetanide  (BUMEX ) 1 MG tablet Take 1 tablet (1 mg total) by mouth 2 (two) times daily. (Patient taking differently: Take 2 mg by mouth 2 (two) times daily.)   CINNAMON PO Take 1,000 mg by mouth daily.   citalopram (CELEXA) 10 MG tablet Take 10 mg by mouth at bedtime.   digoxin  (LANOXIN ) 0.125 MG tablet Take 0.0625 mg by mouth daily.   diphenhydramine-acetaminophen  (TYLENOL  PM) 25-500 MG  TABS tablet Take 2 tablets by mouth at bedtime as needed (sleep).   ENTRESTO 24-26 MG Take 1 tablet by mouth 2 (two) times daily.   ferrous sulfate 325 (65 FE) MG tablet Take 325 mg by mouth daily.   fluticasone (FLONASE) 50 MCG/ACT nasal spray Place 2 sprays into both nostrils daily as needed for allergies.   hydrOXYzine  (ATARAX ) 25 MG tablet Take 1 tablet (25 mg total) by mouth every 6 (six) hours as needed for anxiety or itching.   latanoprost (XALATAN) 0.005 % ophthalmic solution Place 1 drop into both eyes  at bedtime.   magnesium oxide (MAG-OX) 400 MG tablet Take 400 mg by mouth every Monday, Wednesday, and Friday.   melatonin 5 MG TABS Take 5 mg by mouth at bedtime.   metoprolol  tartrate (LOPRESSOR ) 25 MG tablet Take 25 mg by mouth 2 (two) times daily.   montelukast (SINGULAIR) 10 MG tablet Take 10 mg by mouth daily.   Multiple Vitamin (MULTIVITAMIN) tablet Take 1 tablet by mouth daily.   nitrofurantoin (MACRODANTIN) 100 MG capsule Take 100 mg by mouth 2 (two) times daily.   nitroGLYCERIN (NITROSTAT) 0.4 MG SL tablet Place 0.4 mg under the tongue every 5 (five) minutes as needed for chest pain.   polyethylene glycol powder (GOODSENSE CLEARLAX) 17 GM/SCOOP powder Take 17 g by mouth daily.   potassium chloride SA (KLOR-CON M) 20 MEQ tablet Take 20 mEq by mouth daily.   SSD 1 % cream Apply 1 Application topically 2 (two) times daily.   traMADol (ULTRAM) 50 MG tablet Take 50 mg by mouth as needed for moderate pain or severe pain.   XARELTO  15 MG TABS tablet TAKE 1 TABLET BY MOUTH ONCE DAILY WITH SUPPER (Patient taking differently: Take 15 mg by mouth daily with supper.)   Zinc 50 MG TABS Take 50 mg by mouth daily.     Allergies:   Flagyl [metronidazole], Penicillins, and Sulfamethoxazole-trimethoprim   Social History   Socioeconomic History   Marital status: Married    Spouse name: Not on file   Number of children: Not on file   Years of education: Not on file   Highest education level: Not on file  Occupational History   Not on file  Tobacco Use   Smoking status: Never   Smokeless tobacco: Never  Vaping Use   Vaping status: Never Used  Substance and Sexual Activity   Alcohol use: No   Drug use: No   Sexual activity: Not on file  Other Topics Concern   Not on file  Social History Narrative   Not on file   Social Drivers of Health   Financial Resource Strain: Low Risk  (08/21/2022)   [facility]   Overall Financial Resource Strain (CARDIA)    Difficulty of  Paying Living Expenses: Not hard at all  Food Insecurity: No Food Insecurity (08/21/2022)   Received from [facility]   Hunger Vital Sign    Within the past 12 months, you worried that your food would run out before you got the money to buy more.: Never true    Within the past 12 months, the food you bought just didn't last and you didn't have money to get more.: Never true  Transportation Needs: No Transportation Needs (08/21/2022)   Received from [facility] - Transportation    Lack of Transportation (Medical): No    Lack of Transportation (Non-Medical): No  Physical Activity: Not on file  Stress: Not on  file  Social Connections: Unknown (07/16/2022)   Received from [facility]   Social Network    Social Network: Not on file     Family History: The patient's family history includes Anxiety disorder in her sister; Asthma in her sister; Atrial fibrillation in her sister; Diabetes in her mother; GER disease in her sister; Heart failure in her mother; Hypertension in her brother. There is no history of Thyroid  disease. ROS:   Please see the history of present illness.    All 14 point review of systems negative except as described per history of present illness  EKGs/Labs/Other Studies Reviewed:    EKG Interpretation Date/Time:  Thursday March 10 2024 14:22:33 EDT Ventricular Rate:  85 PR Interval:  264 QRS Duration:  126 QT Interval:  406 QTC Calculation: 483 R Axis:   8  Text Interpretation: Undetermined rhythm Left ventricular hypertrophy with QRS widening and repolarization abnormality Abnormal ECG When compared with ECG of 04-Mar-2024 09:58, Current undetermined rhythm precludes rhythm comparison, needs review ST now depressed in Inferior leads QT has lengthened Confirmed by Bernie Charleston [phone]) on 03/10/2024 2:27:30 PM    Recent Labs: 03/11/2023: Hemoglobin 13.8; Platelets 194 03/04/2024: ALT 28; BUN 39; Creatinine, Ser 1.31; NT-Pro BNP 9,956; Potassium 3.8;  Sodium 141  Recent Lipid Panel    Component Value Date/Time   CHOL 127 04/21/2019 0917   TRIG 164 (H) 04/21/2019 0917   HDL 33 (L) 04/21/2019 0917   CHOLHDL 3.8 04/21/2019 0917   LDLCALC 66 04/21/2019 0917    Physical Exam:    VS:  BP 130/80 (BP Location: Left Arm, Patient Position: Sitting)   Pulse 85   Ht 5' 2 (1.575 m)   Wt 158 lb 9.6 oz (71.9 kg)   SpO2 98%   BMI 29.01 kg/m     Wt Readings from Last 3 Encounters:  03/10/24 158 lb 9.6 oz (71.9 kg)  03/04/24 159 lb 9.6 oz (72.4 kg)  11/17/23 158 lb (71.7 kg)     GEN:  Well nourished, well developed in no acute distress HEENT: Normal NECK: No JVD; No carotid bruits LYMPHATICS: No lymphadenopathy CARDIAC: Irregularly, no murmurs, no rubs, no gallops RESPIRATORY:  Clear to auscultation without rales, wheezing or rhonchi  ABDOMEN: Soft, non-tender, non-distended MUSCULOSKELETAL:  No edema; No deformity  SKIN: Warm and dry LOWER EXTREMITIES: no swelling NEUROLOGIC:  Alert and oriented x 3 PSYCHIATRIC:  Normal affect   ASSESSMENT:    1. Permanent atrial fibrillation (HCC)   2. Nonischemic cardiomyopathy (HCC)   3. Essential hypertension   4. History of pulmonary embolism    PLAN:    In order of problems listed above:  Congestive heart failure still decompensated.  I will put subacute furosemide  on her now.  I will ask her to take extra potassium today, will check Chem-7 proBNP tomorrow I will see her back in the office in about 2 weeks. History of nonischemic cardiomyopathy stable right now. Essential hypertension blood pressure well-controlled. History of pulm emboli, anticoagulant Xarelto    Medication Adjustments/Labs and Tests Ordered: Current medicines are reviewed at length with the patient today.  Concerns regarding medicines are outlined above.  Orders Placed This Encounter  Procedures   EKG 12-Lead   Medication changes: No orders of the defined types were placed in this  encounter.   Signed, [place] DOROTHA Bernie, MD, [facility] 03/10/2024 2:43 PM    [facility]

## 2024-03-10 NOTE — Patient Instructions (Addendum)
 Medication Instructions:   Furoscix  - today  Take: 1 potassium today  Lab Work: Your physician recommends that you return for lab work in: tomorrow You need to have labs done when you are fasting.  You can come Monday through Friday 8:30 am to 12:00 pm and 1:15 to 4:30. You do not need to make an appointment as the order has already been placed. The labs you are going to have done are BMP, ProBNP.    Testing/Procedures: None Ordered   Follow-Up: At [facility], you and your health needs are our priority.  As part of our continuing mission to provide you with exceptional heart care, we have created designated Provider Care Teams.  These Care Teams include your primary Cardiologist (physician) and Advanced Practice Providers (APPs -  Physician Assistants and Nurse Practitioners) who all work together to provide you with the care you need, when you need it.  We recommend signing up for the patient portal called MyChart.  Sign up information is provided on this After Visit Summary.  MyChart is used to connect with patients for Virtual Visits (Telemedicine).  Patients are able to view lab/test results, encounter notes, upcoming appointments, etc.  Non-urgent messages can be sent to your provider as well.   To learn more about what you can do with MyChart, go to [email or web address].    Your next appointment:   2 week(s)  The format for your next appointment:   In Person  Provider:   Lamar Fitch, MD    Other Instructions NA

## 2024-03-12 LAB — BASIC METABOLIC PANEL WITH GFR
BUN/Creatinine Ratio: 30 — ABNORMAL HIGH (ref 12–28)
BUN: 50 mg/dL — ABNORMAL HIGH (ref 8–27)
CO2: 22 mmol/L (ref 20–29)
Calcium: 10 mg/dL (ref 8.7–10.3)
Chloride: 100 mmol/L (ref 96–106)
Creatinine, Ser: 1.67 mg/dL — ABNORMAL HIGH (ref 0.57–1.00)
Glucose: 82 mg/dL (ref 70–99)
Potassium: 4 mmol/L (ref 3.5–5.2)
Sodium: 141 mmol/L (ref 134–144)
eGFR: 29 mL/min/1.73 — ABNORMAL LOW (ref >59)

## 2024-03-12 LAB — PRO B NATRIURETIC PEPTIDE: NT-Pro BNP: 10751 pg/mL — ABNORMAL HIGH (ref 0–738)

## 2024-03-22 ENCOUNTER — Ambulatory Visit: Admitting: Cardiology

## 2024-03-23 DIAGNOSIS — I5031 Acute diastolic (congestive) heart failure: Secondary | ICD-10-CM | POA: Diagnosis not present

## 2024-03-23 DIAGNOSIS — I371 Nonrheumatic pulmonary valve insufficiency: Secondary | ICD-10-CM | POA: Diagnosis not present

## 2024-03-23 DIAGNOSIS — I4891 Unspecified atrial fibrillation: Secondary | ICD-10-CM | POA: Diagnosis not present

## 2024-03-23 DIAGNOSIS — Z86711 Personal history of pulmonary embolism: Secondary | ICD-10-CM

## 2024-03-23 DIAGNOSIS — I517 Cardiomegaly: Secondary | ICD-10-CM

## 2024-03-23 DIAGNOSIS — I272 Pulmonary hypertension, unspecified: Secondary | ICD-10-CM

## 2024-03-23 DIAGNOSIS — I361 Nonrheumatic tricuspid (valve) insufficiency: Secondary | ICD-10-CM | POA: Diagnosis not present

## 2024-03-23 DIAGNOSIS — I340 Nonrheumatic mitral (valve) insufficiency: Secondary | ICD-10-CM | POA: Diagnosis not present

## 2024-03-23 DIAGNOSIS — I10 Essential (primary) hypertension: Secondary | ICD-10-CM | POA: Diagnosis not present

## 2024-03-23 DIAGNOSIS — N189 Chronic kidney disease, unspecified: Secondary | ICD-10-CM | POA: Diagnosis not present

## 2024-03-23 DIAGNOSIS — I351 Nonrheumatic aortic (valve) insufficiency: Secondary | ICD-10-CM | POA: Diagnosis not present

## 2024-03-24 DIAGNOSIS — I5031 Acute diastolic (congestive) heart failure: Secondary | ICD-10-CM | POA: Diagnosis not present

## 2024-03-24 DIAGNOSIS — I4891 Unspecified atrial fibrillation: Secondary | ICD-10-CM | POA: Diagnosis not present

## 2024-03-24 DIAGNOSIS — I10 Essential (primary) hypertension: Secondary | ICD-10-CM | POA: Diagnosis not present

## 2024-03-24 DIAGNOSIS — N189 Chronic kidney disease, unspecified: Secondary | ICD-10-CM | POA: Diagnosis not present

## 2024-03-25 DIAGNOSIS — I10 Essential (primary) hypertension: Secondary | ICD-10-CM | POA: Diagnosis not present

## 2024-03-25 DIAGNOSIS — I4891 Unspecified atrial fibrillation: Secondary | ICD-10-CM | POA: Diagnosis not present

## 2024-03-25 DIAGNOSIS — I5031 Acute diastolic (congestive) heart failure: Secondary | ICD-10-CM | POA: Diagnosis not present

## 2024-03-26 DIAGNOSIS — I5023 Acute on chronic systolic (congestive) heart failure: Secondary | ICD-10-CM | POA: Diagnosis not present

## 2024-03-26 DIAGNOSIS — R600 Localized edema: Secondary | ICD-10-CM | POA: Diagnosis not present

## 2024-03-26 DIAGNOSIS — I4891 Unspecified atrial fibrillation: Secondary | ICD-10-CM | POA: Diagnosis not present

## 2024-03-26 DIAGNOSIS — I340 Nonrheumatic mitral (valve) insufficiency: Secondary | ICD-10-CM | POA: Diagnosis not present

## 2024-03-27 DIAGNOSIS — R600 Localized edema: Secondary | ICD-10-CM | POA: Diagnosis not present

## 2024-03-27 DIAGNOSIS — I340 Nonrheumatic mitral (valve) insufficiency: Secondary | ICD-10-CM | POA: Diagnosis not present

## 2024-03-27 DIAGNOSIS — I5023 Acute on chronic systolic (congestive) heart failure: Secondary | ICD-10-CM | POA: Diagnosis not present

## 2024-03-27 DIAGNOSIS — Z86711 Personal history of pulmonary embolism: Secondary | ICD-10-CM

## 2024-03-27 DIAGNOSIS — I4891 Unspecified atrial fibrillation: Secondary | ICD-10-CM | POA: Diagnosis not present

## 2024-03-28 DIAGNOSIS — I340 Nonrheumatic mitral (valve) insufficiency: Secondary | ICD-10-CM | POA: Diagnosis not present

## 2024-03-28 DIAGNOSIS — Z7901 Long term (current) use of anticoagulants: Secondary | ICD-10-CM | POA: Diagnosis not present

## 2024-03-28 DIAGNOSIS — N189 Chronic kidney disease, unspecified: Secondary | ICD-10-CM

## 2024-03-28 DIAGNOSIS — I482 Chronic atrial fibrillation, unspecified: Secondary | ICD-10-CM | POA: Diagnosis not present

## 2024-03-28 DIAGNOSIS — I5020 Unspecified systolic (congestive) heart failure: Secondary | ICD-10-CM | POA: Diagnosis not present

## 2024-03-29 DIAGNOSIS — I340 Nonrheumatic mitral (valve) insufficiency: Secondary | ICD-10-CM | POA: Diagnosis not present

## 2024-03-29 DIAGNOSIS — I5020 Unspecified systolic (congestive) heart failure: Secondary | ICD-10-CM | POA: Diagnosis not present

## 2024-03-29 DIAGNOSIS — Z7901 Long term (current) use of anticoagulants: Secondary | ICD-10-CM | POA: Diagnosis not present

## 2024-03-29 DIAGNOSIS — I482 Chronic atrial fibrillation, unspecified: Secondary | ICD-10-CM | POA: Diagnosis not present

## 2024-03-30 DIAGNOSIS — R2681 Unsteadiness on feet: Secondary | ICD-10-CM | POA: Insufficient documentation

## 2024-03-30 DIAGNOSIS — M2550 Pain in unspecified joint: Secondary | ICD-10-CM | POA: Insufficient documentation

## 2024-03-30 DIAGNOSIS — G629 Polyneuropathy, unspecified: Secondary | ICD-10-CM | POA: Insufficient documentation

## 2024-03-30 DIAGNOSIS — N3001 Acute cystitis with hematuria: Secondary | ICD-10-CM | POA: Insufficient documentation

## 2024-03-30 DIAGNOSIS — M810 Age-related osteoporosis without current pathological fracture: Secondary | ICD-10-CM | POA: Insufficient documentation

## 2024-03-30 DIAGNOSIS — A419 Sepsis, unspecified organism: Secondary | ICD-10-CM | POA: Insufficient documentation

## 2024-03-30 DIAGNOSIS — M6281 Muscle weakness (generalized): Secondary | ICD-10-CM | POA: Insufficient documentation

## 2024-03-30 DIAGNOSIS — J9621 Acute and chronic respiratory failure with hypoxia: Secondary | ICD-10-CM | POA: Insufficient documentation

## 2024-03-30 DIAGNOSIS — N1832 Chronic kidney disease, stage 3b: Secondary | ICD-10-CM | POA: Insufficient documentation

## 2024-03-30 DIAGNOSIS — R410 Disorientation, unspecified: Secondary | ICD-10-CM | POA: Insufficient documentation

## 2024-03-30 DIAGNOSIS — E875 Hyperkalemia: Secondary | ICD-10-CM | POA: Insufficient documentation

## 2024-03-30 DIAGNOSIS — E440 Moderate protein-calorie malnutrition: Secondary | ICD-10-CM | POA: Insufficient documentation

## 2024-03-30 DIAGNOSIS — F32A Depression, unspecified: Secondary | ICD-10-CM | POA: Insufficient documentation

## 2024-03-30 DIAGNOSIS — F419 Anxiety disorder, unspecified: Secondary | ICD-10-CM | POA: Insufficient documentation

## 2024-04-07 ENCOUNTER — Encounter: Payer: Self-pay | Admitting: Internal Medicine

## 2024-04-13 ENCOUNTER — Ambulatory Visit: Admitting: Cardiology

## 2024-05-04 LAB — LAB REPORT - SCANNED: EGFR: 34

## 2024-05-06 NOTE — Progress Notes (Unsigned)
 Cardiology Office Note:  .   Date:  05/11/2024  ID:  Donia Sharps, DOB [birth year], MRN [id number] PCP: Lennie Boom, MD  [facility] Providers Cardiologist:  Lamar Fitch, MD    History of Present Illness: .    Kerri Rogers is a 89 y.o. female with a past medical history of pulmonary hypertension, HFpEF, NICM, permanent atrial fibrillation/flutter, severe MR, NSVT, COPD, GERD, overactive bladder, gout, CKD.  03/23/2024 echo EF 45 to 50%, mild to moderate concentric LVH, LA severely dilated, mild AR, moderate to severe MR, moderate TR, severe pulmonary hypertension 73.8 mmHg 10/28/2022 EF 55 to 60%, LA severely dilated, RA mildly dilated, severe functional MR, TR with mild to moderate, moderate calcification of the aortic valve with mild regurgitation and sclerosis present 07/28/2022 cardiac catheterization at [facility], mild to moderate nonobstructive CAD, RHC numbers elevated  Admitted to [facility] for heart failure exacerbation, UTI.  proBNP was elevated at 26,000.  Labs on day of discharge potassium 3.5, creatinine 1.6, sodium 141.  Evaluated by Dr. Fitch while she was hospitalized, she was started on Lasix  40 mg twice daily as well as 10 mEq of potassium.  Evaluated on 03/11/2023 accompanied by her husband for evaluation of worsening DOE over the last week.  She was volume overloaded, rales appreciated, proBNP  was elevated 11,734, we transitioned her to Bumex  1 mg twice daily.  Repeat creatinine increased to 1.83, GFR 27. Most recently evaluated by Dr. Fitch on 03/10/24 following a recent ED visit where she required IV diuretics, was feeling relatively poorly still, she was given Furoscix  in the office, plans for quick follow up.   She presents today for follow-up accompanied by her husband, she apparently had a recent hospitalization for urosepsis, but is back at home and recovering, they have OT services coming to the house.  Her husband takes meticulous  care of her, he weighs her daily and is well versed in her care.  She does have some pedal edema but it is better than it previously has been.  She has been evaluated by her PCP following her most recent hospitalization and will see them again next week and lab work was repeated.  She will also see her nephrologist in 2 weeks, follows with [facility] in [place]. She denies chest pain, palpitations, dyspnea, pnd, orthopnea, n, v, dizziness, syncope,weight gain, or early satiety.     ROS: Review of Systems  Constitutional: Negative.   HENT: Negative.    Eyes: Negative.   Respiratory: Negative.    Cardiovascular:  Positive for leg swelling.  Gastrointestinal: Negative.   Genitourinary: Negative.   Musculoskeletal: Negative.   Skin: Negative.   Neurological: Negative.   Endo/Heme/Allergies:  Bruises/bleeds easily.  Psychiatric/Behavioral: Negative.       Studies Reviewed: .        Cardiac Studies & Procedures   ______________________________________________________________________________________________     ECHOCARDIOGRAM  ECHOCARDIOGRAM COMPLETE 07/22/2023  Narrative ECHOCARDIOGRAM REPORT    Patient Name:   Kerri Rogers Date of Exam: 07/22/2023 Medical Rec #:  [id number]   Height:       61.0 in Accession #:    [id number]  Weight:       149.4 lb Date of Birth:  1935-03-07   BSA:          1.669 m Patient Age:    88 years    BP:           126/80 mmHg Patient Gender: F  HR:           75 bpm. Exam Location:  [facility]  Procedure: 2D Echo, Cardiac Doppler and Color Doppler  Indications:    Severe mitral regurgitation [I34.0 (ICD-10-CM)]  History:        Patient has prior history of Echocardiogram examinations, most recent 07/08/2022. CHF, Mitral Valve Disease, Arrythmias:Atrial Fibrillation; Risk Factors:Hypertension.  Sonographer:    Charlie Jointer RDCS Referring Phys: [id number] LAMAR JINNY FITCH   Sonographer Comments: Global longitudinal strain was  attempted. IMPRESSIONS   1. Left ventricular ejection fraction, by estimation, is 50 to 55%. The left ventricle has low normal function. The left ventricle demonstrates regional wall motion abnormalities (see scoring diagram/findings for description). The left ventricular internal cavity size was mildly dilated. Left ventricular diastolic parameters are indeterminate. There is moderate hypokinesis of the left ventricular, basal-mid inferior wall. 2. Right ventricular systolic function is normal. The right ventricular size is normal. There is mildly elevated pulmonary artery systolic pressure. 3. Left atrial size was severely dilated. 4. The mitral valve is degenerative. Moderate to severe mitral valve regurgitation. No evidence of mitral stenosis. 5. The aortic valve is calcified. There is mild calcification of the aortic valve. There is mild thickening of the aortic valve. Aortic valve regurgitation is mild. No aortic stenosis is present. 6. The inferior vena cava is normal in size with greater than 50% respiratory variability, suggesting right atrial pressure of 3 mmHg.  FINDINGS Left Ventricle: Left ventricular ejection fraction, by estimation, is 50 to 55%. The left ventricle has low normal function. The left ventricle demonstrates regional wall motion abnormalities. Moderate hypokinesis of the left ventricular, basal-mid inferior wall. The left ventricular internal cavity size was mildly dilated. There is no left ventricular hypertrophy. Left ventricular diastolic parameters are indeterminate.   LV Wall Scoring: The inferior wall is hypokinetic.  Right Ventricle: The right ventricular size is normal. No increase in right ventricular wall thickness. Right ventricular systolic function is normal. There is mildly elevated pulmonary artery systolic pressure. The tricuspid regurgitant velocity is 2.81 m/s, and with an assumed right atrial pressure of 8 mmHg, the estimated right ventricular  systolic pressure is 39.6 mmHg.  Left [facility]: Left atrial size was severely dilated.  Right [facility]: Right atrial size was normal in size.  Pericardium: There is no evidence of pericardial effusion.  Mitral Valve: The mitral valve is degenerative in appearance. There is moderate thickening of the mitral valve leaflet(s). There is moderate calcification of the mitral valve leaflet(s). Moderate to severe mitral valve regurgitation, with posteriorly-directed jet. No evidence of mitral valve stenosis.  Tricuspid Valve: The tricuspid valve is normal in structure. Tricuspid valve regurgitation is not demonstrated. No evidence of tricuspid stenosis.  Aortic Valve: The aortic valve is calcified. There is mild calcification of the aortic valve. There is mild thickening of the aortic valve. Aortic valve regurgitation is mild. Aortic regurgitation PHT measures 1026 msec. No aortic stenosis is present.  Pulmonic Valve: The pulmonic valve was normal in structure. Pulmonic valve regurgitation is mild. No evidence of pulmonic stenosis.  Aorta: The aortic root is normal in size and structure.  Venous: The inferior vena cava is normal in size with greater than 50% respiratory variability, suggesting right atrial pressure of 3 mmHg.  IAS/Shunts: No atrial level shunt detected by color flow Doppler.   LEFT VENTRICLE PLAX 2D LVIDd:         6.10 cm   Diastology LVIDs:         5.40 cm  LV e' medial:    5.87 cm/s LV PW:         1.00 cm   LV E/e' medial:  28.6 LV IVS:        1.00 cm   LV e' lateral:   10.90 cm/s LVOT diam:     1.90 cm   LV E/e' lateral: 15.4 LV SV:         42 LV SV Index:   25 LVOT Area:     2.84 cm   RIGHT VENTRICLE            IVC RV Basal diam:  4.00 cm    IVC diam: 2.10 cm RV Mid diam:    3.10 cm RV S prime:     8.05 cm/s TAPSE (M-mode): 1.5 cm  LEFT ATRIUM              Index        RIGHT ATRIUM           Index LA diam:        5.40 cm  3.24 cm/m   RA Area:     18.50 cm LA  Vol (A2C):   81.5 ml  48.85 ml/m  RA Volume:   44.20 ml  26.49 ml/m LA Vol (A4C):   103.0 ml 61.73 ml/m LA Biplane Vol: 94.4 ml  56.58 ml/m AORTIC VALVE LVOT Vmax:   80.05 cm/s LVOT Vmean:  52.550 cm/s LVOT VTI:    0.148 m AI PHT:      1026 msec  AORTA Ao Root diam: 2.90 cm Ao Asc diam:  3.20 cm Ao Desc diam: 2.10 cm  MITRAL VALVE                  TRICUSPID VALVE MV Area (PHT): 4.21 cm       TR Peak grad:   31.6 mmHg MV Decel Time: 180 msec       TR Vmax:        281.00 cm/s MR Peak grad:    143.0 mmHg MR Mean grad:    94.0 mmHg    SHUNTS MR Vmax:         598.00 cm/s  Systemic VTI:  0.15 m MR Vmean:        460.0 cm/s   Systemic Diam: 1.90 cm MR PISA:         3.08 cm MR PISA Eff ROA: 20 mm MR PISA Radius:  0.70 cm MV E velocity: 168.00 cm/s  Lamar Fitch MD Electronically signed by Lamar Fitch MD Signature Date/Time: 07/22/2023/4:50:09 PM    Final   TEE  ECHO TEE 10/28/2022  Narrative TRANSESOPHOGEAL ECHO REPORT    Patient Name:   KOURTNEI RAUBER Date of Exam: 10/28/2022 Medical Rec #:  [id number]   Height:       61.0 in Accession #:    [id number]  Weight:       152.4 lb Date of Birth:  [birth year]   BSA:          1.683 m Patient Age:    87 years    BP:           159/85 mmHg Patient Gender: F           HR:           110 bpm. Exam Location:  Inpatient  Procedure: 2D Echo, 3D Echo, Cardiac Doppler and Color Doppler  Indications:    Severe mitral regurgitation  History:  Patient has prior history of Echocardiogram examinations, most recent 07/08/2022. CHF, COPD, Arrythmias:Atrial Fibrillation and Atrial Flutter, Signs/Symptoms:Shortness of Breath; Risk Factors:Hypertension.  Sonographer:    Clotilda Center Referring Phys: [id number] MAUDE JAYSON EMMER  PROCEDURE: After discussion of the risks and benefits of a TEE, an informed consent was obtained from the patient. TEE procedure time was 19 minutes. The transesophogeal probe was passed without difficulty  through the esophogus of the patient. Imaged were obtained with the patient in a left lateral decubitus position. Sedation performed by different physician. The patient was monitored while under deep sedation. Anesthestetic sedation was provided intravenously by Anesthesiology: 208.95mg  of Propofol . Image quality was good. The patient's vital signs; including heart rate, blood pressure, and oxygen  saturation; remained stable throughout the procedure. The patient developed no complications during the procedure.  IMPRESSIONS   1. Left ventricular ejection fraction, by estimation, is 55 to 60%. The left ventricle has normal function. 2. Right ventricular systolic function is normal. The right ventricular size is normal. 3. Left atrial size was severely dilated. No left atrial/left atrial appendage thrombus was detected. 4. Right atrial size was mildly dilated. 5. Severe functional MR atrial FMR and tethered posterior leaflet MR is medial to A2/P2 with large area of calcium on anterior leaflet and retracted short posterior leaflet with sub chordal calcification Not ideal for mitral clip. The mitral valve is abnormal. Severe mitral valve regurgitation. 6. Tricuspid valve regurgitation is mild to moderate. 7. The aortic valve is tricuspid. There is moderate calcification of the aortic valve. Aortic valve regurgitation is mild. Aortic valve sclerosis is present, with no evidence of aortic valve stenosis.  FINDINGS Left Ventricle: Left ventricular ejection fraction, by estimation, is 55 to 60%. The left ventricle has normal function. The left ventricular internal cavity size was normal in size.  Right Ventricle: The right ventricular size is normal. Right vetricular wall thickness was not assessed. Right ventricular systolic function is normal.  Left [facility]: Left atrial size was severely dilated. No left atrial/left atrial appendage thrombus was detected.  Right [facility]: Right atrial size was mildly  dilated.  Pericardium: There is no evidence of pericardial effusion.  Mitral Valve: Severe functional MR atrial FMR and tethered posterior leaflet MR is medial to A2/P2 with large area of calcium on anterior leaflet and retracted short posterior leaflet with sub chordal calcification Not ideal for mitral clip. The mitral valve is abnormal. Severe mitral valve regurgitation. MV peak gradient, 9.5 mmHg. The mean mitral valve gradient is 3.0 mmHg.  Tricuspid Valve: The tricuspid valve is normal in structure. Tricuspid valve regurgitation is mild to moderate.  Aortic Valve: The aortic valve is tricuspid. There is moderate calcification of the aortic valve. Aortic valve regurgitation is mild. Aortic valve sclerosis is present, with no evidence of aortic valve stenosis.  Pulmonic Valve: The pulmonic valve was normal in structure. Pulmonic valve regurgitation is mild.  Aorta: The aortic root is normal in size and structure.  IAS/Shunts: No atrial level shunt detected by color flow Doppler.  Additional Comments: Spectral Doppler performed.  MITRAL VALVE MV Peak grad: 9.5 mmHg MV Mean grad: 3.0 mmHg MV Vmax:      1.54 m/s MV Vmean:     75.7 cm/s MR Peak grad:    143.0 mmHg MR Mean grad:    98.0 mmHg MR Vmax:         598.00 cm/s MR Vmean:        467.0 cm/s MR PISA:  1.57 cm MR PISA Eff ROA: 10 mm MR PISA Radius:  0.50 cm  Maude Emmer MD Electronically signed by Maude Emmer MD Signature Date/Time: 10/28/2022/10:04:51 AM    Final        ______________________________________________________________________________________________      Risk Assessment/Calculations:    CHA2DS2-VASc Score =     This indicates a  % annual risk of stroke. The patient's score is based upon:              Physical Exam:   VS:  BP (!) 110/50   Pulse 65   Ht 5' 2 (1.575 m)   Wt 155 lb (70.3 kg)   SpO2 99%   BMI 28.35 kg/m    Wt Readings from Last 3 Encounters:  05/11/24 155 lb  (70.3 kg)  03/10/24 158 lb 9.6 oz (71.9 kg)  03/04/24 159 lb 9.6 oz (72.4 kg)    GEN: Well nourished, well developed in no acute distress NECK: No JVD; No carotid bruits CARDIAC: irregularly irregular, no murmurs, rubs, gallops RESPIRATORY: No rales, wheezing or rhonchi  ABDOMEN: Soft, non-tender, non-distended EXTREMITIES: +2 pitting edema; No deformity   ASSESSMENT AND PLAN: .    HFpEF-NYHA class II, her legs are edematous but her lung sounds are clear.  Continue digoxin  0.0625 mg daily, Lasix  60 mg twice daily, metoprolol  25 mg twice daily, Entresto 24-26 mg twice daily.  Was previously on Farxiga however this has been discontinued likely in the setting of her urosepsis.  CAD-nonobstructive per [facility] in 2023; continue nitroglycerin as needed, Stable with no anginal symptoms. No indication for ischemic evaluation.  She is on Xarelto  therefore she is not on aspirin.  PAF/hypercoagulable state- CHA2DS2-VASc score of 5, rate is controlled, denies hematochezia, hematuria, hemoptysis.  Continue Xarelto  15 mg daily--creatinine clearance 30 dose reduction indicated, continue digoxin  0.0625 mg  daily, continue metoprolol  25 mg twice daily.   Severe MR -relatively stable with her symptoms.  Previously referred to our structural heart team, it appears she would likely not have been a candidate for transcatheter mitral valve therapies.  CKD - followed by nephrology, [facility] in [place], careful titration of antihypertensives and diuretics.  She will see their office in 2 weeks.       Dispo: Follow up with Dr. Krasowski in 3 months.   Signed, Delon JAYSON Hoover, NP

## 2024-05-11 ENCOUNTER — Encounter: Payer: Self-pay | Admitting: Cardiology

## 2024-05-11 ENCOUNTER — Ambulatory Visit: Attending: Cardiology | Admitting: Cardiology

## 2024-05-11 VITALS — BP 110/50 | HR 65 | Ht 62.0 in | Wt 155.0 lb

## 2024-05-11 DIAGNOSIS — I5032 Chronic diastolic (congestive) heart failure: Secondary | ICD-10-CM | POA: Diagnosis not present

## 2024-05-11 DIAGNOSIS — I428 Other cardiomyopathies: Secondary | ICD-10-CM

## 2024-05-11 DIAGNOSIS — I340 Nonrheumatic mitral (valve) insufficiency: Secondary | ICD-10-CM

## 2024-05-11 DIAGNOSIS — D6859 Other primary thrombophilia: Secondary | ICD-10-CM

## 2024-05-11 DIAGNOSIS — I4821 Permanent atrial fibrillation: Secondary | ICD-10-CM

## 2024-05-11 DIAGNOSIS — I10 Essential (primary) hypertension: Secondary | ICD-10-CM

## 2024-05-11 NOTE — Patient Instructions (Signed)
 Medication Instructions:  Your physician recommends that you continue on your current medications as directed. Please refer to the Current Medication list given to you today.  *If you need a refill on your cardiac medications before your next appointment, please call your pharmacy*  Lab Work: NONE If you have labs (blood work) drawn today and your tests are completely normal, you will receive your results only by: MyChart Message (if you have MyChart) OR A paper copy in the mail If you have any lab test that is abnormal or we need to change your treatment, we will call you to review the results.  Testing/Procedures: NONE  Follow-Up: At [facility], you and your health needs are our priority.  As part of our continuing mission to provide you with exceptional heart care, our providers are all part of one team.  This team includes your primary Cardiologist (physician) and Advanced Practice Providers or APPs (Physician Assistants and Nurse Practitioners) who all work together to provide you with the care you need, when you need it.  Your next appointment:   3 month(s)  Provider:   Gypsy Balsam, MD    We recommend signing up for the patient portal called "MyChart".  Sign up information is provided on this After Visit Summary.  MyChart is used to connect with patients for Virtual Visits (Telemedicine).  Patients are able to view lab/test results, encounter notes, upcoming appointments, etc.  Non-urgent messages can be sent to your provider as well.   To learn more about what you can do with MyChart, go to [email or web address].   Other Instructions

## 2024-06-25 ENCOUNTER — Other Ambulatory Visit: Payer: Self-pay | Admitting: Cardiology

## 2024-07-25 ENCOUNTER — Ambulatory Visit: Admitting: Cardiology

## 2024-08-08 ENCOUNTER — Ambulatory Visit: Admitting: Cardiology

## 2024-09-06 ENCOUNTER — Encounter: Payer: Self-pay | Admitting: *Deleted

## 2024-09-07 ENCOUNTER — Encounter: Payer: Self-pay | Admitting: Cardiology

## 2024-09-07 ENCOUNTER — Ambulatory Visit: Attending: Cardiology | Admitting: Cardiology

## 2024-09-07 VITALS — BP 110/64 | HR 72 | Ht 62.0 in | Wt 159.2 lb

## 2024-09-07 DIAGNOSIS — I4821 Permanent atrial fibrillation: Secondary | ICD-10-CM

## 2024-09-07 DIAGNOSIS — I4729 Other ventricular tachycardia: Secondary | ICD-10-CM

## 2024-09-07 DIAGNOSIS — I50814 Right heart failure due to left heart failure: Secondary | ICD-10-CM | POA: Diagnosis not present

## 2024-09-07 DIAGNOSIS — I428 Other cardiomyopathies: Secondary | ICD-10-CM

## 2024-09-07 NOTE — Progress Notes (Unsigned)
 " Cardiology Office Note:    Date:  09/07/2024   ID:  Kerri Rogers, DOB October 27, 1934, MRN [id number]  PCP:  Kerri Boom, MD  Cardiologist:  Kerri Fitch, MD    Referring MD: Kerri Boom, MD   No chief complaint on file. Doing fine  History of Present Illness:     Kerri Rogers is a 89 y.o. female past medical history significant for nonischemic cardiomyopathy, last ejection fraction was normal she does have severe mitral regurgitation she is not a candidate for intervention because of overall poor condition and advanced age additional problems include COPD, GERD, chronic kidney failure.  Comes today to my office to follow-up overall doing well.  Denies have any chest pain tightness squeezing pressure burning chest happy where she is  Past Medical History:  Diagnosis Date   Acute on chronic respiratory failure with hypoxemia (HCC) 03/30/2024   Adjustment disorder with physical complaints 07/26/2022   Anxiety disorder 03/30/2024   Asthma with bronchitis and status asthmaticus    Atrial fibrillation (HCC)    Atrial flutter, paroxysmal (HCC) 03/01/2015   B12 deficiency    Bladder spasm    CHF (congestive heart failure) (HCC)    Chronic constipation    COPD (chronic obstructive pulmonary disease) (HCC)    Depressive disorder 03/30/2024   Disorientated 03/30/2024   Edema    Essential hypertension 03/01/2015   GERD (gastroesophageal reflux disease)    Gout    Hematuria due to acute cystitis 03/30/2024   Herniated lumbar intervertebral disc    History of pulmonary embolism 03/01/2015   Hyperkalemia 03/30/2024   Hypertension    Insomnia    Joint pain 03/30/2024   Long term (current) use of anticoagulants    Moderate protein-calorie malnutrition (weight for age 61-74% of standard) 03/30/2024   Muscle weakness 03/30/2024   Nonischemic cardiomyopathy (HCC) 07/28/2022   Formatting of this note might be different from the original. EF 35-40%     Nonobstructive atherosclerosis of  coronary artery 07/28/2022   NSVT (nonsustained ventricular tachycardia) (HCC) 08/22/2022   Osteoporosis 03/30/2024   Overactive bladder    Peripheral autonomic neuropathy    Polyneuropathy 03/30/2024   Pre-ulcerative calluses    Pulmonary HTN (HCC) 07/28/2022   Formatting of this note might be different from the original. Probably due to MR and lung disease     Sepsis (HCC) 03/30/2024   Severe mitral regurgitation 07/21/2022   Shortness of breath 06/18/2022   Skin rash    Stage 3b chronic kidney disease (HCC) 03/30/2024   Stasis dermatitis    Unsteadiness on feet 03/30/2024   Ventral hernia    Vitamin D  deficiency     Past Surgical History:  Procedure Laterality Date   ABDOMINAL HYSTERECTOMY     BLADDER SURGERY     CATARACT EXTRACTION     REPLACEMENT TOTAL KNEE BILATERAL     TEE WITHOUT CARDIOVERSION N/A 10/28/2022   Procedure: TRANSESOPHAGEAL ECHOCARDIOGRAM (TEE);  Surgeon: Delford Maude BROCKS, MD;  Location: [facility] ENDOSCOPY;  Service: Cardiovascular;  Laterality: N/A;   TONSILLECTOMY      Current Medications: Active Medications[1]   Allergies:   Barium sulfate, Cefepime, Trimethoprim, Flagyl [metronidazole], Penicillins, and Sulfamethoxazole-trimethoprim   Social History   Socioeconomic History   Marital status: Married    Spouse name: Not on file   Number of children: Not on file   Years of education: Not on file   Highest education level: Not on file  Occupational History   Not on file  Tobacco  Use   Smoking status: Never   Smokeless tobacco: Never  Vaping Use   Vaping status: Never Used  Substance and Sexual Activity   Alcohol use: No   Drug use: No   Sexual activity: Not on file  Other Topics Concern   Not on file  Social History Narrative   Not on file   Social Drivers of Health   Tobacco Use: Low Risk (09/07/2024)   Patient History    Smoking Tobacco Use: Never    Smokeless Tobacco Use: Never    Passive Exposure: Not on file  Financial Resource  Strain: Low Risk (08/21/2022)   Received from [facility]   Overall Financial Resource Strain (CARDIA)    Difficulty of Paying Living Expenses: Not hard at all  Food Insecurity: No Food Insecurity (08/21/2022)   Received from [facility]   Epic    Within the past 12 months, you worried that your food would run out before you got the money to buy more.: Never true    Within the past 12 months, the food you bought just didn't last and you didn't have money to get more.: Never true  Transportation Needs: No Transportation Needs (08/21/2022)   Received from [facility] - Transportation    Lack of Transportation (Medical): No    Lack of Transportation (Non-Medical): No  Physical Activity: Not on file  Stress: Not on file  Social Connections: Not on file  Depression (EYV7-0): Not on file  Alcohol Screen: Not on file  Housing: Low Risk (07/23/2022)   Received from [facility]   Epic    What is your living situation today?: I have a steady place to live    Think about the place you live. Do you have problems with any of the following? Choose all that apply:: None/None on this list  Utilities: Not At Risk (08/21/2022)   Received from [facility] Utilities    Threatened with loss of utilities: No  Health Literacy: Not on file     Family History: The patient's family history includes Anxiety disorder in her sister; Asthma in her sister; Atrial fibrillation in her sister; Diabetes in her mother; GER disease in her sister; Heart failure in her mother; Hypertension in her brother. There is no history of Thyroid  disease. ROS:   Please see the history of present illness.    All 14 point review of systems negative except as described per history of present illness  EKGs/Labs/Other Studies Reviewed:         Recent Labs: 03/04/2024: ALT 28 03/11/2024: BUN 50; Creatinine, Ser 1.67; NT-Pro BNP 10,751; Potassium 4.0; Sodium 141  Recent Lipid Panel    Component Value Date/Time    CHOL 127 04/21/2019 0917   TRIG 164 (H) 04/21/2019 0917   HDL 33 (L) 04/21/2019 0917   CHOLHDL 3.8 04/21/2019 0917   LDLCALC 66 04/21/2019 0917    Physical Exam:    VS:  BP 110/64   Pulse 72   Ht 5' 2 (1.575 m)   Wt 159 lb 4 oz (72.2 kg)   SpO2 97%   BMI 29.13 kg/m     Wt Readings from Last 3 Encounters:  09/07/24 159 lb 4 oz (72.2 kg)  05/11/24 155 lb (70.3 kg)  03/10/24 158 lb 9.6 oz (71.9 kg)     GEN:  Well nourished, well developed in no acute distress HEENT: Normal NECK: No JVD; No carotid bruits LYMPHATICS: No lymphadenopathy CARDIAC: Irregular  irregular, holosystolic murmur best heard at left border sternal 2-3/6, no rubs, no gallops RESPIRATORY:  Clear to auscultation without rales, wheezing or rhonchi  ABDOMEN: Soft, non-tender, non-distended MUSCULOSKELETAL:  No edema; No deformity  SKIN: Warm and dry LOWER EXTREMITIES: Lower extremities are wrapped NEUROLOGIC:  Alert and oriented x 3 PSYCHIATRIC:  Normal affect   ASSESSMENT:    1. 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 Permanent atrial fibrillation (HCC)   3. Nonischemic cardiomyopathy (HCC)   4. NSVT (nonsustained ventricular tachycardia) (HCC)    PLAN:    In order of problems listed above:  Congestive heart failure appears to be compensated.  Will continue present management weight is stable. Permanent atrial fibrillation rate is controlled, she is anticoagulant Xarelto  dose is appropriate to her kidney function. History of nonischemic cardiomyopathy appears to be compensated on her physical exam we will continue present management.  She understand that options are very limited but seems to be doing well with present management   Medication Adjustments/Labs and Tests Ordered: Current medicines are reviewed at length with the patient today.  Concerns regarding medicines are outlined above.  No orders of the defined types were placed in this  encounter.  Medication changes: No orders of the defined types were placed in this encounter.   Signed, Kerri DOROTHA Fitch, MD, [facility] 09/07/2024 4:08 PM     [facility]    [1]  Current Meds  Medication Sig   acetaminophen  (TYLENOL ) 500 MG tablet Take 1,000 mg by mouth 2 (two) times daily as needed for mild pain or moderate pain.   albuterol (VENTOLIN HFA) 108 (90 Base) MCG/ACT inhaler Inhale 1-2 puffs into the lungs every 4 (four) hours as needed for shortness of breath or wheezing.   CINNAMON PO Take 1,000 mg by mouth daily.   digoxin  (LANOXIN ) 0.125 MG tablet Take 0.0625 mg by mouth daily.   diphenhydramine-acetaminophen  (TYLENOL  PM) 25-500 MG TABS tablet Take 2 tablets by mouth at bedtime as needed (sleep).   ENTRESTO 24-26 MG Take 1 tablet by mouth 2 (two) times daily.   furosemide  (LASIX ) 40 MG tablet Take 60 mg by mouth 2 (two) times daily.   latanoprost (XALATAN) 0.005 % ophthalmic solution Place 1 drop into both eyes at bedtime.   melatonin 5 MG TABS Take 5 mg by mouth at bedtime.   metoprolol  tartrate (LOPRESSOR ) 25 MG tablet Take 25 mg by mouth 2 (two) times daily.   montelukast (SINGULAIR) 10 MG tablet Take 10 mg by mouth daily.   Multiple Vitamin (MULTIVITAMIN) tablet Take 1 tablet by mouth daily.   nitroGLYCERIN (NITROSTAT) 0.4 MG SL tablet Place 0.4 mg under the tongue every 5 (five) minutes as needed for chest pain.   polyethylene glycol powder (GOODSENSE CLEARLAX) 17 GM/SCOOP powder Take 17 g by mouth daily.   potassium chloride SA (KLOR-CON M) 20 MEQ tablet Take 20 mEq by mouth 3 (three) times a week.   SSD 1 % cream Apply 1 Application topically 2 (two) times daily.   traMADol (ULTRAM) 50 MG tablet Take 50 mg by mouth as needed for moderate pain or severe pain.   XARELTO  15 MG TABS tablet TAKE 1 TABLET BY MOUTH ONCE DAILY WITH SUPPER   "

## 2024-09-07 NOTE — Patient Instructions (Signed)
